# Patient Record
Sex: Female | Born: 1945 | Race: White | Hispanic: No | Marital: Married | Smoking: Never smoker
Health system: Southern US, Community
[De-identification: ages and names within clinical notes are randomized; demographics above are authoritative.]

## PROBLEM LIST (undated history)

## (undated) DIAGNOSIS — E039 Hypothyroidism, unspecified: Secondary | ICD-10-CM

## (undated) DIAGNOSIS — L659 Nonscarring hair loss, unspecified: Secondary | ICD-10-CM

## (undated) DIAGNOSIS — R0989 Other specified symptoms and signs involving the circulatory and respiratory systems: Secondary | ICD-10-CM

## (undated) DIAGNOSIS — L989 Disorder of the skin and subcutaneous tissue, unspecified: Secondary | ICD-10-CM

## (undated) DIAGNOSIS — M549 Dorsalgia, unspecified: Secondary | ICD-10-CM

## (undated) DIAGNOSIS — E109 Type 1 diabetes mellitus without complications: Secondary | ICD-10-CM

## (undated) DIAGNOSIS — I10 Essential (primary) hypertension: Secondary | ICD-10-CM

## (undated) DIAGNOSIS — F411 Generalized anxiety disorder: Secondary | ICD-10-CM

## (undated) DIAGNOSIS — I2510 Atherosclerotic heart disease of native coronary artery without angina pectoris: Secondary | ICD-10-CM

## (undated) DIAGNOSIS — E785 Hyperlipidemia, unspecified: Secondary | ICD-10-CM

## (undated) DIAGNOSIS — K219 Gastro-esophageal reflux disease without esophagitis: Secondary | ICD-10-CM

## (undated) DIAGNOSIS — J069 Acute upper respiratory infection, unspecified: Secondary | ICD-10-CM

## (undated) HISTORY — PX: CORONARY ARTERY BYPASS GRAFT: SHX141

## (undated) HISTORY — DX: Atherosclerotic heart disease of native coronary artery without angina pectoris: I25.10

## (undated) HISTORY — PX: TUBAL LIGATION: SHX77

## (undated) HISTORY — DX: Gastro-esophageal reflux disease without esophagitis: K21.9

## (undated) HISTORY — PX: CATARACT EXTRACTION: SUR2

## (undated) HISTORY — DX: Nonscarring hair loss, unspecified: L65.9

## (undated) HISTORY — DX: Acute upper respiratory infection, unspecified: J06.9

## (undated) HISTORY — PX: EYE SURGERY: SHX253

## (undated) HISTORY — DX: Other specified symptoms and signs involving the circulatory and respiratory systems: R09.89

## (undated) HISTORY — DX: Dorsalgia, unspecified: M54.9

## (undated) HISTORY — DX: Type 1 diabetes mellitus without complications: E10.9

## (undated) HISTORY — DX: Hyperlipidemia, unspecified: E78.5

## (undated) HISTORY — DX: Hypothyroidism, unspecified: E03.9

## (undated) HISTORY — DX: Generalized anxiety disorder: F41.1

## (undated) HISTORY — DX: Essential (primary) hypertension: I10

## (undated) HISTORY — PX: OTHER SURGICAL HISTORY: SHX169

## (undated) HISTORY — DX: Disorder of the skin and subcutaneous tissue, unspecified: L98.9

---

## 2004-11-14 ENCOUNTER — Emergency Department (HOSPITAL_COMMUNITY): Admission: EM | Admit: 2004-11-14 | Discharge: 2004-11-14 | Payer: Self-pay | Admitting: Emergency Medicine

## 2004-11-16 ENCOUNTER — Emergency Department (HOSPITAL_COMMUNITY): Admission: EM | Admit: 2004-11-16 | Discharge: 2004-11-16 | Payer: Self-pay | Admitting: Emergency Medicine

## 2007-07-22 ENCOUNTER — Ambulatory Visit: Payer: Self-pay | Admitting: Internal Medicine

## 2007-07-22 DIAGNOSIS — E039 Hypothyroidism, unspecified: Secondary | ICD-10-CM | POA: Insufficient documentation

## 2007-07-22 DIAGNOSIS — E785 Hyperlipidemia, unspecified: Secondary | ICD-10-CM | POA: Insufficient documentation

## 2007-07-22 DIAGNOSIS — I2510 Atherosclerotic heart disease of native coronary artery without angina pectoris: Secondary | ICD-10-CM | POA: Insufficient documentation

## 2007-07-22 DIAGNOSIS — E1065 Type 1 diabetes mellitus with hyperglycemia: Secondary | ICD-10-CM

## 2007-07-22 DIAGNOSIS — I10 Essential (primary) hypertension: Secondary | ICD-10-CM | POA: Insufficient documentation

## 2007-07-22 DIAGNOSIS — E109 Type 1 diabetes mellitus without complications: Secondary | ICD-10-CM

## 2007-07-22 HISTORY — DX: Type 1 diabetes mellitus without complications: E10.9

## 2007-07-22 HISTORY — DX: Hyperlipidemia, unspecified: E78.5

## 2007-07-22 HISTORY — DX: Hypothyroidism, unspecified: E03.9

## 2007-07-22 HISTORY — DX: Atherosclerotic heart disease of native coronary artery without angina pectoris: I25.10

## 2007-07-22 HISTORY — DX: Essential (primary) hypertension: I10

## 2007-07-23 LAB — CONVERTED CEMR LAB
ALT: 20 units/L (ref 0–35)
AST: 22 units/L (ref 0–37)
Albumin: 4.2 g/dL (ref 3.5–5.2)
Alkaline Phosphatase: 80 units/L (ref 39–117)
BUN: 8 mg/dL (ref 6–23)
Basophils Absolute: 0 10*3/uL (ref 0.0–0.1)
Basophils Relative: 0.2 % (ref 0.0–1.0)
Bilirubin Urine: NEGATIVE
Bilirubin, Direct: 0.1 mg/dL (ref 0.0–0.3)
CO2: 30 meq/L (ref 19–32)
Calcium: 9.7 mg/dL (ref 8.4–10.5)
Chloride: 105 meq/L (ref 96–112)
Cholesterol: 202 mg/dL (ref 0–200)
Creatinine, Ser: 0.8 mg/dL (ref 0.4–1.2)
Creatinine,U: 21.9 mg/dL
Direct LDL: 104.3 mg/dL
Eosinophils Absolute: 0.1 10*3/uL (ref 0.0–0.7)
Eosinophils Relative: 1.9 % (ref 0.0–5.0)
GFR calc Af Amer: 94 mL/min
GFR calc non Af Amer: 78 mL/min
Glucose, Bld: 191 mg/dL — ABNORMAL HIGH (ref 70–99)
HCT: 40.3 % (ref 36.0–46.0)
HDL: 74.9 mg/dL (ref >39.0)
Hemoglobin, Urine: NEGATIVE
Hemoglobin: 13.9 g/dL (ref 12.0–15.0)
Hgb A1c MFr Bld: 7.9 % — ABNORMAL HIGH (ref 4.6–6.0)
Ketones, ur: NEGATIVE mg/dL
Leukocytes, UA: NEGATIVE
Lymphocytes Relative: 23.5 % (ref 12.0–46.0)
MCHC: 34.4 g/dL (ref 30.0–36.0)
MCV: 89.6 fL (ref 78.0–100.0)
Microalb Creat Ratio: 9.1 mg/g (ref 0.0–30.0)
Microalb, Ur: 0.2 mg/dL (ref 0.0–1.9)
Monocytes Absolute: 0.3 10*3/uL (ref 0.1–1.0)
Monocytes Relative: 5.3 % (ref 3.0–12.0)
Neutro Abs: 4 10*3/uL (ref 1.4–7.7)
Neutrophils Relative %: 69.1 % (ref 43.0–77.0)
Nitrite: NEGATIVE
Platelets: 283 10*3/uL (ref 150–400)
Potassium: 5.2 meq/L — ABNORMAL HIGH (ref 3.5–5.1)
RBC: 4.5 M/uL (ref 3.87–5.11)
RDW: 12.8 % (ref 11.5–14.6)
Sodium: 139 meq/L (ref 135–145)
Specific Gravity, Urine: <=1.005 (ref 1.000–1.03)
TSH: 0.9 microintl units/mL (ref 0.35–5.50)
Total Bilirubin: 0.7 mg/dL (ref 0.3–1.2)
Total CHOL/HDL Ratio: 2.7
Total Protein, Urine: NEGATIVE mg/dL
Total Protein: 7.5 g/dL (ref 6.0–8.3)
Triglycerides: 61 mg/dL (ref 0–149)
Urine Glucose: NEGATIVE mg/dL
Urobilinogen, UA: 0.2 (ref 0.0–1.0)
VLDL: 12 mg/dL (ref 0–40)
WBC: 5.8 10*3/uL (ref 4.5–10.5)
pH: 7 (ref 5.0–8.0)

## 2007-10-05 ENCOUNTER — Ambulatory Visit: Payer: Self-pay | Admitting: Cardiology

## 2007-10-27 ENCOUNTER — Ambulatory Visit: Payer: Self-pay | Admitting: Internal Medicine

## 2007-10-27 ENCOUNTER — Encounter (INDEPENDENT_AMBULATORY_CARE_PROVIDER_SITE_OTHER): Payer: Self-pay | Admitting: *Deleted

## 2007-10-27 DIAGNOSIS — L989 Disorder of the skin and subcutaneous tissue, unspecified: Secondary | ICD-10-CM

## 2007-10-27 HISTORY — DX: Disorder of the skin and subcutaneous tissue, unspecified: L98.9

## 2007-10-28 DIAGNOSIS — F411 Generalized anxiety disorder: Secondary | ICD-10-CM | POA: Insufficient documentation

## 2007-10-28 HISTORY — DX: Generalized anxiety disorder: F41.1

## 2007-10-30 ENCOUNTER — Telehealth (INDEPENDENT_AMBULATORY_CARE_PROVIDER_SITE_OTHER): Payer: Self-pay | Admitting: *Deleted

## 2007-11-16 ENCOUNTER — Ambulatory Visit: Payer: Self-pay

## 2007-11-16 ENCOUNTER — Encounter: Payer: Self-pay | Admitting: Cardiology

## 2007-11-30 ENCOUNTER — Ambulatory Visit: Payer: Self-pay | Admitting: Endocrinology

## 2007-11-30 LAB — CONVERTED CEMR LAB: Hgb A1c MFr Bld: 7.5 % — ABNORMAL HIGH (ref 4.6–6.0)

## 2007-12-23 ENCOUNTER — Ambulatory Visit: Payer: Self-pay | Admitting: Internal Medicine

## 2007-12-23 DIAGNOSIS — M549 Dorsalgia, unspecified: Secondary | ICD-10-CM | POA: Insufficient documentation

## 2007-12-23 DIAGNOSIS — J069 Acute upper respiratory infection, unspecified: Secondary | ICD-10-CM

## 2007-12-23 DIAGNOSIS — K219 Gastro-esophageal reflux disease without esophagitis: Secondary | ICD-10-CM

## 2007-12-23 HISTORY — DX: Acute upper respiratory infection, unspecified: J06.9

## 2007-12-23 HISTORY — DX: Gastro-esophageal reflux disease without esophagitis: K21.9

## 2007-12-23 HISTORY — DX: Dorsalgia, unspecified: M54.9

## 2008-06-01 ENCOUNTER — Telehealth: Payer: Self-pay | Admitting: Internal Medicine

## 2008-08-22 ENCOUNTER — Encounter (INDEPENDENT_AMBULATORY_CARE_PROVIDER_SITE_OTHER): Payer: Self-pay | Admitting: *Deleted

## 2008-09-13 ENCOUNTER — Ambulatory Visit: Payer: Self-pay | Admitting: Internal Medicine

## 2008-09-14 ENCOUNTER — Encounter: Payer: Self-pay | Admitting: Internal Medicine

## 2008-09-14 LAB — CONVERTED CEMR LAB
ALT: 20 units/L (ref 0–35)
AST: 21 units/L (ref 0–37)
Albumin: 4.3 g/dL (ref 3.5–5.2)
Alkaline Phosphatase: 84 units/L (ref 39–117)
BUN: 14 mg/dL (ref 6–23)
Basophils Absolute: 0 10*3/uL (ref 0.0–0.1)
Basophils Relative: 0.1 % (ref 0.0–3.0)
Bilirubin Urine: NEGATIVE
Bilirubin, Direct: 0.1 mg/dL (ref 0.0–0.3)
CO2: 31 meq/L (ref 19–32)
Calcium: 9.7 mg/dL (ref 8.4–10.5)
Chloride: 105 meq/L (ref 96–112)
Cholesterol: 226 mg/dL — ABNORMAL HIGH (ref 0–200)
Creatinine, Ser: 0.9 mg/dL (ref 0.4–1.2)
Creatinine,U: 47.4 mg/dL
Direct LDL: 137.1 mg/dL
Eosinophils Absolute: 0.1 10*3/uL (ref 0.0–0.7)
Eosinophils Relative: 1.7 % (ref 0.0–5.0)
GFR calc non Af Amer: 67.25 mL/min (ref >60)
Glucose, Bld: 147 mg/dL — ABNORMAL HIGH (ref 70–99)
HCT: 41.7 % (ref 36.0–46.0)
HDL: 63.2 mg/dL (ref >39.00)
Hemoglobin, Urine: NEGATIVE
Hemoglobin: 14.5 g/dL (ref 12.0–15.0)
Hgb A1c MFr Bld: 7.7 % — ABNORMAL HIGH (ref 4.6–6.5)
Ketones, ur: NEGATIVE mg/dL
Leukocytes, UA: NEGATIVE
Lymphocytes Relative: 21.8 % (ref 12.0–46.0)
Lymphs Abs: 1.5 10*3/uL (ref 0.7–4.0)
MCHC: 34.8 g/dL (ref 30.0–36.0)
MCV: 89.6 fL (ref 78.0–100.0)
Microalb Creat Ratio: 2.1 mg/g (ref 0.0–30.0)
Microalb, Ur: 0.1 mg/dL (ref 0.0–1.9)
Monocytes Absolute: 0.3 10*3/uL (ref 0.1–1.0)
Monocytes Relative: 4.1 % (ref 3.0–12.0)
Neutro Abs: 5 10*3/uL (ref 1.4–7.7)
Neutrophils Relative %: 72.3 % (ref 43.0–77.0)
Nitrite: NEGATIVE
Platelets: 264 10*3/uL (ref 150.0–400.0)
Potassium: 4.6 meq/L (ref 3.5–5.1)
RBC: 4.66 M/uL (ref 3.87–5.11)
RDW: 12.8 % (ref 11.5–14.6)
Sodium: 142 meq/L (ref 135–145)
Specific Gravity, Urine: <=1.005 (ref 1.000–1.030)
TSH: 1.64 microintl units/mL (ref 0.35–5.50)
Total Bilirubin: 0.9 mg/dL (ref 0.3–1.2)
Total CHOL/HDL Ratio: 4
Total Protein, Urine: NEGATIVE mg/dL
Total Protein: 8 g/dL (ref 6.0–8.3)
Triglycerides: 93 mg/dL (ref 0.0–149.0)
Urine Glucose: NEGATIVE mg/dL
Urobilinogen, UA: 0.2 (ref 0.0–1.0)
VLDL: 18.6 mg/dL (ref 0.0–40.0)
WBC: 6.9 10*3/uL (ref 4.5–10.5)
pH: 6.5 (ref 5.0–8.0)

## 2008-09-15 LAB — CONVERTED CEMR LAB: Vit D, 25-Hydroxy: 17 ng/mL — ABNORMAL LOW (ref 30–89)

## 2008-10-17 ENCOUNTER — Encounter: Payer: Self-pay | Admitting: Internal Medicine

## 2009-02-17 ENCOUNTER — Ambulatory Visit: Payer: Self-pay | Admitting: Internal Medicine

## 2009-02-17 DIAGNOSIS — L659 Nonscarring hair loss, unspecified: Secondary | ICD-10-CM

## 2009-02-17 HISTORY — DX: Nonscarring hair loss, unspecified: L65.9

## 2009-02-18 LAB — CONVERTED CEMR LAB
CO2: 19 meq/L (ref 19–32)
Calcium: 10.4 mg/dL (ref 8.4–10.5)
Cholesterol: 201 mg/dL — ABNORMAL HIGH (ref 0–200)
HDL: 70 mg/dL (ref >39)
Sodium: 139 meq/L (ref 135–145)
Total CHOL/HDL Ratio: 2.9

## 2009-02-20 LAB — CONVERTED CEMR LAB: TSH: 0.57 microintl units/mL (ref 0.35–5.50)

## 2009-05-17 ENCOUNTER — Encounter: Payer: Self-pay | Admitting: Internal Medicine

## 2009-07-28 ENCOUNTER — Ambulatory Visit: Payer: Self-pay | Admitting: Cardiology

## 2009-07-28 DIAGNOSIS — R0989 Other specified symptoms and signs involving the circulatory and respiratory systems: Secondary | ICD-10-CM

## 2009-07-28 HISTORY — DX: Other specified symptoms and signs involving the circulatory and respiratory systems: R09.89

## 2009-08-02 ENCOUNTER — Telehealth (INDEPENDENT_AMBULATORY_CARE_PROVIDER_SITE_OTHER): Payer: Self-pay | Admitting: *Deleted

## 2009-08-03 ENCOUNTER — Encounter (HOSPITAL_COMMUNITY): Admission: RE | Admit: 2009-08-03 | Discharge: 2009-10-11 | Payer: Self-pay | Admitting: Cardiology

## 2009-08-03 ENCOUNTER — Ambulatory Visit: Payer: Self-pay

## 2009-08-03 ENCOUNTER — Encounter: Payer: Self-pay | Admitting: Cardiology

## 2009-08-03 ENCOUNTER — Ambulatory Visit: Payer: Self-pay | Admitting: Cardiology

## 2009-08-04 LAB — CONVERTED CEMR LAB
BUN: 22 mg/dL (ref 6–23)
CO2: 26 meq/L (ref 19–32)
Chloride: 99 meq/L (ref 96–112)
Creatinine, Ser: 0.8 mg/dL (ref 0.4–1.2)

## 2009-08-16 ENCOUNTER — Telehealth: Payer: Self-pay | Admitting: Internal Medicine

## 2009-08-16 ENCOUNTER — Ambulatory Visit: Payer: Self-pay | Admitting: Internal Medicine

## 2009-08-16 LAB — CONVERTED CEMR LAB
ALT: 19 units/L (ref 0–35)
AST: 20 units/L (ref 0–37)
BUN: 18 mg/dL (ref 6–23)
Basophils Relative: 0.4 % (ref 0.0–3.0)
Bilirubin, Direct: 0.1 mg/dL (ref 0.0–0.3)
CO2: 31 meq/L (ref 19–32)
Calcium: 9.5 mg/dL (ref 8.4–10.5)
Creatinine, Ser: 0.7 mg/dL (ref 0.4–1.2)
Eosinophils Absolute: 0.2 10*3/uL (ref 0.0–0.7)
Glucose, Bld: 80 mg/dL (ref 70–99)
Lymphs Abs: 2.2 10*3/uL (ref 0.7–4.0)
MCHC: 34.2 g/dL (ref 30.0–36.0)
MCV: 90.5 fL (ref 78.0–100.0)
Monocytes Absolute: 0.4 10*3/uL (ref 0.1–1.0)
Neutrophils Relative %: 64.5 % (ref 43.0–77.0)
Platelets: 287 10*3/uL (ref 150.0–400.0)
Total Bilirubin: 0.7 mg/dL (ref 0.3–1.2)

## 2010-02-01 ENCOUNTER — Encounter: Payer: Self-pay | Admitting: Internal Medicine

## 2010-02-06 ENCOUNTER — Ambulatory Visit: Admit: 2010-02-06 | Payer: Self-pay | Admitting: Internal Medicine

## 2010-02-13 ENCOUNTER — Encounter: Payer: Self-pay | Admitting: Internal Medicine

## 2010-02-14 ENCOUNTER — Ambulatory Visit
Admission: RE | Admit: 2010-02-14 | Discharge: 2010-02-14 | Payer: Self-pay | Source: Home / Self Care | Attending: Internal Medicine | Admitting: Internal Medicine

## 2010-02-14 ENCOUNTER — Other Ambulatory Visit: Payer: Self-pay | Admitting: Internal Medicine

## 2010-02-14 LAB — BASIC METABOLIC PANEL
BUN: 13 mg/dL (ref 6–23)
CO2: 30 mEq/L (ref 19–32)
Calcium: 9.8 mg/dL (ref 8.4–10.5)
Chloride: 103 mEq/L (ref 96–112)
Creatinine, Ser: 0.8 mg/dL (ref 0.4–1.2)
GFR: 82.62 mL/min (ref >60.00)
Glucose, Bld: 58 mg/dL — ABNORMAL LOW (ref 70–99)
Potassium: 5.2 mEq/L — ABNORMAL HIGH (ref 3.5–5.1)
Sodium: 140 mEq/L (ref 135–145)

## 2010-02-14 LAB — TSH: TSH: 0.83 u[IU]/mL (ref 0.35–5.50)

## 2010-02-14 LAB — LIPID PANEL
Cholesterol: 236 mg/dL — ABNORMAL HIGH (ref 0–200)
HDL: 68.9 mg/dL (ref >39.00)
Total CHOL/HDL Ratio: 3
Triglycerides: 91 mg/dL (ref 0.0–149.0)
VLDL: 18.2 mg/dL (ref 0.0–40.0)

## 2010-02-14 LAB — HEMOGLOBIN A1C: Hgb A1c MFr Bld: 8 % — ABNORMAL HIGH (ref 4.6–6.5)

## 2010-02-14 LAB — LDL CHOLESTEROL, DIRECT: Direct LDL: 147.2 mg/dL

## 2010-02-15 ENCOUNTER — Telehealth: Payer: Self-pay | Admitting: Internal Medicine

## 2010-02-15 ENCOUNTER — Encounter: Payer: Self-pay | Admitting: Internal Medicine

## 2010-03-01 ENCOUNTER — Encounter: Payer: Self-pay | Admitting: Internal Medicine

## 2010-03-06 NOTE — Progress Notes (Signed)
Summary: Nuclear Pre-Procedure  Phone Note Outgoing Call   Call placed by: Milana Na, EMT-P,  August 02, 2009 1:03 PM Summary of Call: Reviewed information on Myoview Information Sheet (see scanned document for further details).  Spoke with patient.     Nuclear Med Background Indications for Stress Test: Evaluation for Ischemia, Graft Patency   History: CABG, Echo, Heart Catheterization, Stents  History Comments: '05 Heart Cath--Stents(failed) 12/05 CABG 10/09 ECHO EF 60% mild -mod. TR     Nuclear Pre-Procedure Cardiac Risk Factors: Carotid Disease, Hypertension, IDDM Type 1, Lipids Height (in): 64  Nuclear Med Study Referring MD:  B.Crenshaw

## 2010-03-06 NOTE — Letter (Signed)
Summary: [facility]  [facility]   Imported By: Lennie Odor 05/25/2009 12:29:45  _____________________________________________________________________  External Attachment:    Type:   Image     Comment:   External Document

## 2010-03-06 NOTE — Assessment & Plan Note (Signed)
Summary: Cardiology Nuclear Study  Nuclear Med Background Indications for Stress Test: Evaluation for Ischemia, Graft Patency   History: CABG, Echo, Heart Catheterization, Stents  History Comments: '05 Heart Cath--Stents(failed) 12/05 CABG 10/09 ECHO EF 60% mild -mod. TR     Nuclear Pre-Procedure Cardiac Risk Factors: Carotid Disease, Hypertension, IDDM Type 1, Lipids Caffeine/Decaff Intake: None NPO After: 11:00 PM Lungs: clear IV 0.9% NS with Angio Cath: 20g     IV Site: (R) AC IV Started by: Stanton Kidney EMT-P Chest Size (in) 38     Cup Size B     Height (in): 64 Weight (lb): 160 BMI: 27.56 Tech Comments: CBG=111 @ 7:30 am, per Patient.  This patient had positive changes on her EKG during her exercise [place] stress test. Her pictures were good, Dr. Shirlee Latch (DOD) reviewed the tracings and approved for her to leave.  Nuclear Med Study 1 or 2 day study:  1 day     Stress Test Type:  Stress Reading MD:  Olga Millers, MD     Referring MD:  B.Crenshaw Resting Radionuclide:  Technetium 34m Tetrofosmin     Resting Radionuclide Dose:  11.0 mCi  Stress Radionuclide:  Technetium 32m Tetrofosmin     Stress Radionuclide Dose:  33.0 mCi   Stress Protocol Exercise Time (min):  7:00 min     Max HR:  137 bpm     Predicted Max HR:  157 bpm  Max Systolic BP: 192 mm Hg     Percent Max HR:  87.26 %     METS: 8.5 Rate Pressure Product:  16109    Stress Test Technologist:  Milana Na EMT-P     Nuclear Technologist:  Domenic Polite CNMT  Rest Procedure  Myocardial perfusion imaging was performed at rest 45 minutes following the intravenous administration of Myoview Technetium 96m Tetrofosmin.  Stress Procedure  The patient exercised for 7:00. The patient stopped due to fatigue and denied any chest pain.  There were significant ST-T wave changes.  Myoview was injected at peak exercise and myocardial perfusion imaging was performed after a brief delay.  QPS Raw Data Images:   Acuisition technically good; normal left ventricular size. Stress Images:  There is normal uptake in all areas. Rest Images:  Normal homogeneous uptake in all areas of the myocardium. Subtraction (SDS):  No evidence of ischemia. Transient Ischemic Dilatation:  .91  (Normal <1.22)  Lung/Heart Ratio:  .31  (Normal <0.45)  Quantitative Gated Spect Images QGS EDV:  46 ml QGS ESV:  10 ml QGS EF:  78 % QGS cine images:  Normal wall motion.   Overall Impression  Exercise Capacity: Fair exercise capacity. BP Response: Normal blood pressure response. Clinical Symptoms: No chest pain ECG Impression: Insignificant upsloping ST segment depression. Overall Impression: There is no sign of scar or ischemia.  Appended Document: Cardiology Nuclear Study pt aware of results    Appended Document: Cardiology Nuclear Study ok

## 2010-03-06 NOTE — Assessment & Plan Note (Signed)
Summary: HAIR IS FALLING OUT/NWS   Vital Signs:  Patient profile:   65 year old female Height:      64 inches Weight:      156 pounds BMI:     26.87 O2 Sat:      98 % on Room air Temp:     97.5 degrees F oral Pulse rate:   89 / minute BP sitting:   160 / 96  (left arm) Cuff size:   regular  Vitals Entered ByZella Ball Ewing (February 17, 2009 2:53 PM)  O2 Flow:  Room air CC: Hair loss/RE   CC:  Hair loss/RE.  History of Present Illness: c/o increased stress in the last 2 months,  has had some hair thinning as well so stopped her BP med in the past wk since she saw on the side effects regarding hair loss;  Pt denies CP, sob, doe, wheezing, orthopnea, pnd, worsening LE edema, palps, dizziness or syncope  Pt denies new neuro symptoms such as headache, facial or extremity weakness   trying to follow low chol diet, but has not been taking the Vit D as per last OV>  Pt denies polydipsia, polyuria, or low sugar symptoms such as shakiness improved with eating.  Overall good compliance with meds, trying to follow low chol, DM diet, wt stable, little excercise however Wants flu shot today, but has not yet started statin.    Problems Prior to Update: 1)  Hair Loss  (ICD-704.00) 2)  Skin Lesion  (ICD-709.9) 3)  Preventive Health Care  (ICD-V70.0) 4)  Gerd  (ICD-530.81) 5)  Uri  (ICD-465.9) 6)  Back Pain  (ICD-724.5) 7)  Anxiety  (ICD-300.00) 8)  Skin Lesion  (ICD-709.9) 9)  Hyperlipidemia  (ICD-272.4) 10)  Preventive Health Care  (ICD-V70.0) 11)  Hypertension  (ICD-401.9) 12)  Hypothyroidism  (ICD-244.9) 13)  Coronary Artery Disease  (ICD-414.00) 14)  Diabetes Mellitus, Type I  (ICD-250.01)  Medications Prior to Update: 1)  Levoxyl 88 Mcg  Tabs (Levothyroxine Sodium) .Marland Kitchen.. 1po Once Daily 2)  Novolog 100 Unit/ml  Soln (Insulin Aspart) .... 35-40 Units Daily 3)  Amlodipine Besylate 5 Mg Tabs (Amlodipine Besylate) .Marland Kitchen.. 1 By Mouth Once Daily 4)  Losartan Potassium 100 Mg Tabs (Losartan  Potassium) .Marland Kitchen.. 1 By Mouth Once Daily 5)  Aspir-Low 81 Mg Tbec (Aspirin) .Marland Kitchen.. 1 By Mouth Qd 6)  Simvastatin 40 Mg Tabs (Simvastatin) .Marland Kitchen.. 1 By Mouth Once Daily  Current Medications (verified): 1)  Levoxyl 88 Mcg  Tabs (Levothyroxine Sodium) .Marland Kitchen.. 1po Once Daily 2)  Novolog 100 Unit/ml  Soln (Insulin Aspart) .... 35-40 Units Daily 3)  Amlodipine Besylate 5 Mg Tabs (Amlodipine Besylate) .Marland Kitchen.. 1 By Mouth Once Daily 4)  Losartan Potassium 100 Mg Tabs (Losartan Potassium) .Marland Kitchen.. 1 By Mouth Once Daily 5)  Aspir-Low 81 Mg Tbec (Aspirin) .Marland Kitchen.. 1 By Mouth Qd 6)  Simvastatin 40 Mg Tabs (Simvastatin) .Marland Kitchen.. 1 By Mouth Once Daily 7)  Vitamin D3 1000 Unit Tabs (Cholecalciferol) .Marland Kitchen.. 1 By Mouth Once Daily  Allergies (verified): 1)  ! Accupril 2)  ! Toprol Xl (Metoprolol Succinate) 3)  Cozaar 4)  Lipitor 5)  * Benicar  Past History:  Past Surgical History: Last updated: 07/22/2007 s/p coronary stent Coronary artery bypass graft s/p c-section x 2  Social History: Last updated: 07/22/2007 Married Never Smoked Alcohol use-no 2 children work - Manufacturing engineer for [employer]  Risk Factors: Smoking Status: never (07/22/2007)  Past Medical History: Diabetes mellitus, type I - sees Dr  Kumar Coronary artery disease -  Hypothyroidism Hypertension Anxiety GERD  Review of Systems       all otherwise negative per pt -   Physical Exam  General:  alert and well-developed.   Head:  normocephalic and atraumatic.   Eyes:  vision grossly intact, pupils equal, and pupils round.   Ears:  R ear normal and L ear normal.   Nose:  no external deformity and no nasal discharge.   Mouth:  no gingival abnormalities and pharynx pink and moist.   Neck:  supple and no masses.   Lungs:  normal respiratory effort and normal breath sounds.   Heart:  normal rate and regular rhythm.   Extremities:  no edema, no erythema  Skin:  no alopeci, may have some mild diffuse hair thinning   Impression &  Recommendations:  Problem # 1:  HAIR LOSS (ICD-704.00)  Orders: TLB-TSH (Thyroid Stimulating Hormone) (84443-TSH)  doubt BP med as cause, more likely stress or thyroid - to check TSH  Problem # 2:  HYPERTENSION (ICD-401.9)  Her updated medication list for this problem includes:    Amlodipine Besylate 5 Mg Tabs (Amlodipine besylate) .Marland Kitchen... 1 by mouth once daily    Losartan Potassium 100 Mg Tabs (Losartan potassium) .Marland Kitchen... 1 by mouth once daily  BP today: 160/96 Prior BP: 160/74 (09/13/2008)  Labs Reviewed: K+: 4.6 (09/13/2008) Creat: : 0.9 (09/13/2008)   Chol: 226 (09/13/2008)   HDL: 63.20 (09/13/2008)   LDL: DEL (07/22/2007)   TG: 93.0 (09/13/2008) to re-start BP med, f/u next visit  Problem # 3:  DIABETES MELLITUS, TYPE I (ICD-250.01)  Her updated medication list for this problem includes:    Novolog 100 Unit/ml Soln (Insulin aspart) .Marland KitchenMarland KitchenMarland KitchenMarland Kitchen 35-40 units daily    Losartan Potassium 100 Mg Tabs (Losartan potassium) .Marland Kitchen... 1 by mouth once daily    Aspir-low 81 Mg Tbec (Aspirin) .Marland Kitchen... 1 by mouth qd  Orders: T- * Misc. Laboratory test [phone]) TLB-A1C / Hgb A1C (Glycohemoglobin) (83036-A1C) stable overall by hx and exam, ok to continue meds/tx as is, Pt to cont DM diet, excercise, wt loss efforts; to check labs today   Problem # 4:  HYPERLIPIDEMIA (ICD-272.4)  Her updated medication list for this problem includes:    Simvastatin 40 Mg Tabs (Simvastatin) .Marland Kitchen... 1 by mouth once daily  Labs Reviewed: SGOT: 21 (09/13/2008)   SGPT: 20 (09/13/2008)   HDL:63.20 (09/13/2008), 74.9 (07/22/2007)  LDL:DEL (07/22/2007)  Chol:226 (09/13/2008), 202 (07/22/2007)  Trig:93.0 (09/13/2008), 61 (07/22/2007) d/w pt - to start statin, Pt to continue diet efforts, good med tolerance; to check labs - goal LDL less than 70   Complete Medication List: 1)  Levoxyl 88 Mcg Tabs (Levothyroxine sodium) .Marland Kitchen.. 1po once daily 2)  Novolog 100 Unit/ml Soln (Insulin aspart) .... 35-40 units daily 3)  Amlodipine  Besylate 5 Mg Tabs (Amlodipine besylate) .Marland Kitchen.. 1 by mouth once daily 4)  Losartan Potassium 100 Mg Tabs (Losartan potassium) .Marland Kitchen.. 1 by mouth once daily 5)  Aspir-low 81 Mg Tbec (Aspirin) .Marland Kitchen.. 1 by mouth qd 6)  Simvastatin 40 Mg Tabs (Simvastatin) .Marland Kitchen.. 1 by mouth once daily 7)  Vitamin D3 1000 Unit Tabs (Cholecalciferol) .Marland Kitchen.. 1 by mouth once daily  Other Orders: Flu Vaccine 40yrs + (60454) Administration Flu vaccine - MCR (U9811)  Patient Instructions: 1)  Please go to the Lab in the basement for your blood and/or urine tests today 2)  please start vit D 1000 units per day (OTC) 3)  you had the flu  shot 4)  Continue all previous medications as before this visit , including the losartan as this should not be related to the hair loss 5)  please keep your appt with DR Lucianne Muss as planned 6)  Please schedule a follow-up appointment in 6 months with CPX labs and: 7)  HbgA1C prior to visit, ICD-9: 250.02 8)  Urine Microalbumin prior to visit, ICD-9:     Flu Vaccine Consent Questions     Do you have a history of severe allergic reactions to this vaccine? no    Any prior history of allergic reactions to egg and/or gelatin? no    Do you have a sensitivity to the preservative Thimersol? no    Do you have a past history of Guillan-Barre Syndrome? no    Do you currently have an acute febrile illness? no    Have you ever had a severe reaction to latex? no    Vaccine information given and explained to patient? yes    Are you currently pregnant? no    Lot Number:[id number]   Exp Date:08/03/2009   Site Given  Left Deltoid IMflu

## 2010-03-06 NOTE — Assessment & Plan Note (Signed)
Summary: 1 YR ROV  PER PT  PFH,RN  Medications Added LOSARTAN POTASSIUM-HCTZ 100-12.5 MG TABS (LOSARTAN POTASSIUM-HCTZ) ONE TABLET by mouth once daily ASPIR-LOW 81 MG TBEC (ASPIRIN) HOLD 1 by mouth once daily MULTIVITAMINS   TABS (MULTIPLE VITAMIN) 1 tab by mouth once daily PRAVASTATIN SODIUM 40 MG TABS (PRAVASTATIN SODIUM) Take one tablet by mouth daily at bedtime        History of Present Illness: Stacy Cain is a very pleasant  female who has a past medical history of coronary artery disease who returns for follow up.    In 2005, she had a stent that apparently failed and underwent bypass surgery in December 2005. Echocardiogram in October of 2009 showed normal LV function and mild to moderate tricuspid regurgitation. I last saw her in August of 2009. Since then the patient denies any dyspnea on exertion, orthopnea, PND,  palpitations, syncope or chest pain. She has developed pedal edema which she attributed to her losartan and discontinued that medication.    Current Medications (verified): 1)  Levoxyl 88 Mcg  Tabs (Levothyroxine Sodium) .Marland Kitchen.. 1po Once Daily 2)  Novolog 100 Unit/ml  Soln (Insulin Aspart) .... 35-40 Units Daily 3)  Amlodipine Besylate 5 Mg Tabs (Amlodipine Besylate) .Marland Kitchen.. 1 By Mouth Once Daily 4)  Losartan Potassium 100 Mg Tabs (Losartan Potassium) .... Hold 1 By Mouth Once Daily 5)  Aspir-Low 81 Mg Tbec (Aspirin) .... Hold 1 By Mouth Once Daily 6)  Multivitamins   Tabs (Multiple Vitamin) .Marland Kitchen.. 1 Tab By Mouth Once Daily  Allergies: 1)  ! Accupril 2)  ! Toprol Xl (Metoprolol Succinate) 3)  Cozaar 4)  Lipitor 5)  * Benicar  Past History:  Past Medical History: HYPERLIPIDEMIA (ICD-272.4) HYPERTENSION (ICD-401.9) CORONARY ARTERY DISEASE (ICD-414.00) GERD (ICD-530.81) ANXIETY (ICD-300.00) DIABETES MELLITUS, TYPE I (ICD-250.01) - sees Dr Lucianne Muss HYPOTHYROIDISM (ICD-244.9)  Past Surgical History: Reviewed history from 03/29/2009 and no changes required. s/p coronary  stent Coronary artery bypass graft s/p c-section x 2  tubal ligation  Social History: Reviewed history from 07/22/2007 and no changes required. 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 Remaining systems are negative.   Vital Signs:  Patient profile:   65 year old female Height:      64 inches Weight:      164 pounds BMI:     28.25 Pulse rate:   84 / minute Resp:     12 per minute BP sitting:   160 / 60  (left arm)  Vitals Entered By: Kem Parkinson (July 28, 2009 2:13 PM)  Physical Exam  General:  Well-developed well-nourished in no acute distress.  Skin is warm and dry.  HEENT is normal.  Neck is supple. No thyromegaly. Right carotid bruit. Chest is clear to auscultation with normal expansion.  Cardiovascular exam is regular rate and rhythm.  Abdominal exam nontender or distended. No masses palpated. Extremities show trace edema. neuro grossly intact    EKG  Procedure date:  07/28/2009  Findings:      Sinus rhythm at a rate of 84. Nonspecific ST changes.  Impression & Recommendations:  Problem # 1:  CAROTID BRUIT (ICD-785.9) Check carotid Dopplers.  Problem # 2:  HYPERLIPIDEMIA (ICD-272.4)  Patient discontinued Zocor on her own. Will add Pravachol 40 mg p.o. daily. Check lipids  and liver in 6 weeks. The following medications were removed from the medication list:    Simvastatin 40 Mg Tabs (Simvastatin) .Marland Kitchen... 1 by mouth once daily Her updated medication list for this problem includes:    Pravastatin Sodium 40 Mg Tabs (Pravastatin sodium) .Marland Kitchen... Take one tablet by mouth daily at bedtime  The following medications were removed from the medication list:    Simvastatin 40 Mg Tabs (Simvastatin) .Marland Kitchen... 1 by mouth  once daily  Problem # 3:  HYPERTENSION (ICD-401.9) Patient is complaining of lower extremity edema. Discontinue amlodipine to see if this helps. Resume losartan  at previous dose and add HCTZ 12.5 mg p.o. daily. Check bmet in one week. The following medications were removed from the medication list:    Amlodipine Besylate 5 Mg Tabs (Amlodipine besylate) .Marland Kitchen... 1 by mouth once daily Her updated medication list for this problem includes:    Losartan Potassium 100 Mg Tabs (Losartan potassium) ..... Hold 1 by mouth once daily    Aspir-low 81 Mg Tbec (Aspirin) ..... Hold 1 by mouth once daily  Problem # 4:  CORONARY ARTERY DISEASE (ICD-414.00)  Continue aspirin. Add statin. Schedule Myoview. The following medications were removed from the medication list:    Amlodipine Besylate 5 Mg Tabs (Amlodipine besylate) .Marland Kitchen... 1 by mouth once daily Her updated medication list for this problem includes:    Aspir-low 81 Mg Tbec (Aspirin) ..... Hold 1 by mouth once daily  Orders: Nuclear Stress Test (Nuc Stress Test)  The following medications were removed from the medication list:    Amlodipine Besylate 5 Mg Tabs (Amlodipine besylate) .Marland Kitchen... 1 by mouth once daily Her updated medication list for this problem includes:    Aspir-low 81 Mg Tbec (Aspirin) ..... Hold 1 by mouth once daily  Problem # 5:  GERD (ICD-530.81)  Problem # 6:  DIABETES MELLITUS, TYPE I (ICD-250.01)  Her updated medication list for this problem includes:    Novolog 100 Unit/ml Soln (Insulin aspart) .Marland KitchenMarland KitchenMarland KitchenMarland Kitchen 35-40 units daily    Losartan Potassium-hctz 100-12.5 Mg Tabs (Losartan potassium-hctz) ..... One tablet by mouth once daily    Aspir-low 81 Mg Tbec (Aspirin) ..... Hold 1 by mouth once daily  Her updated medication list for this problem includes:    Novolog 100 Unit/ml Soln (Insulin aspart) .Marland KitchenMarland KitchenMarland KitchenMarland Kitchen 35-40 units daily    Losartan Potassium-hctz 100-12.5 Mg Tabs (Losartan potassium-hctz) ..... One tablet by mouth once daily     Aspir-low 81 Mg Tbec (Aspirin) ..... Hold 1 by mouth once daily  Problem # 7:  HYPOTHYROIDISM (ICD-244.9)  Her updated medication list for this problem includes:    Levoxyl 88 Mcg Tabs (Levothyroxine sodium) .Marland Kitchen... 1po once daily  Her updated medication list for this problem includes:    Levoxyl 88 Mcg Tabs (Levothyroxine sodium) .Marland Kitchen... 1po once daily  Other Orders: Carotid Duplex (Carotid Duplex)  Patient Instructions: 1)  Your physician recommends that you schedule a follow-up appointment in: ONE YEAR 2)  Your physician recommends that you return for a FASTING lipid profile: IN 6 WEEKS-LIPID/LIVER-272.0/V58.69 3)  Your physician recommends that you return for lab work in:ONE WEEK WITH STRESS TEST- BMP-401.1/V58.69 4)  Your physician has recommended you make the following change in your medication: STOP AMLODIPINE 5)  START LOSARTAN/HCT 100/12.5MG  ONE TABLET ONCE DAILY 6)  START PRAVASTATIN 40MG  ONCE DAILY AT BEDTIME 7)  Your physician has requested that you have a carotid duplex. This test is an ultrasound of the carotid arteries in your neck. It looks at  blood flow through these arteries that supply the brain with blood. Allow one hour for this exam. There are no restrictions or special instructions. 8)  Your physician has requested that you have an exercise stress myoview.  For further information please visit [email or web address]  Please follow instruction sheet, as given. Prescriptions: PRAVASTATIN SODIUM 40 MG TABS (PRAVASTATIN SODIUM) Take one tablet by mouth daily at bedtime  #30 x 12   Entered by:   Deliah Goody, RN   Authorized by:   Ferman Hamming, MD, [facility]   Signed by:   Deliah Goody, RN on 07/28/2009   Method used:   Electronically to        [facility] [address] [phone]* (retail)       [address]       [place], [place]  [address]       Ph: [phone] or [phone]       Fax: [phone]   RxID:   [id number] LOSARTAN  POTASSIUM-HCTZ 100-12.5 MG TABS (LOSARTAN POTASSIUM-HCTZ) ONE TABLET by mouth once daily  #30 x 12   Entered by:   Deliah Goody, RN   Authorized by:   Ferman Hamming, MD, [facility]   Signed by:   Deliah Goody, RN on 07/28/2009   Method used:   [facility]        [facility] [address] [phone]* (retail)       [address]       [place], [place]  [address]       Ph: [phone] or [phone]       Fax: [phone]   RxID:   [id number]

## 2010-03-06 NOTE — Assessment & Plan Note (Signed)
Summary: 6 mos f/u #/cd   Vital Signs:  Patient profile:   65 year old female Height:      64 inches Weight:      163 pounds BMI:     28.08 O2 Sat:      96 % on Room air Temp:     98.6 degrees F oral Pulse rate:   82 / minute BP sitting:   160 / 78  (left arm) Cuff size:   regular  Vitals Entered By: [name] CMA Duncan Dull) (August 16, 2009 1:35 PM)  O2 Flow:  Room air  Preventive Care Screening     decelines colonosocpy and tetanus  CC: 6 month followup/RE   CC:  6 month followup/RE.  History of Present Illness: here to f/u - amlodipine was stopped due to leg edema, now on current meds; Pt denies CP, sob, doe, wheezing, orthopnea, pnd, worsening LE edema, palps, dizziness or syncope  Pt denies new neuro symptoms such as headache, facial or extremity weakness  Pt denies polydipsia, polyuria, or low sugar symptoms such as shakiness improved with eating.  Overall good compliance with meds, trying to follow low chol, DM diet, wt stable, little excercise however  Is quite wary of new meds, has not started the new statin. and certatily does not want to consider change in DM or HTN meds today  Preventive Screening-Counseling & Management      Drug Use:  no.    Problems Prior to Update: 1)  Carotid Bruit  (ICD-785.9) 2)  Hyperlipidemia  (ICD-272.4) 3)  Hypertension  (ICD-401.9) 4)  Coronary Artery Disease  (ICD-414.00) 5)  Hair Loss  (ICD-704.00) 6)  Skin Lesion  (ICD-709.9) 7)  Preventive Health Care  (ICD-V70.0) 8)  Gerd  (ICD-530.81) 9)  Uri  (ICD-465.9) 10)  Back Pain  (ICD-724.5) 11)  Anxiety  (ICD-300.00) 12)  Skin Lesion  (ICD-709.9) 13)  Preventive Health Care  (ICD-V70.0) 14)  Diabetes Mellitus, Type I  (ICD-250.01) 15)  Hypothyroidism  (ICD-244.9)  Medications Prior to Update: 1)  Levoxyl 88 Mcg  Tabs (Levothyroxine Sodium) .Marland Kitchen.. 1po Once Daily 2)  Novolog 100 Unit/ml  Soln (Insulin Aspart) .... 35-40 Units Daily 3)  Losartan Potassium-Hctz 100-12.5 Mg Tabs  (Losartan Potassium-Hctz) .... One Tablet By Mouth Once Daily 4)  Aspir-Low 81 Mg Tbec (Aspirin) .... Hold 1 By Mouth Once Daily 5)  Multivitamins   Tabs (Multiple Vitamin) .Marland Kitchen.. 1 Tab By Mouth Once Daily 6)  Pravastatin Sodium 40 Mg Tabs (Pravastatin Sodium) .... Take One Tablet By Mouth Daily At Bedtime  Current Medications (verified): 1)  Levoxyl 88 Mcg  Tabs (Levothyroxine Sodium) .Marland Kitchen.. 1po Once Daily 2)  Novolog 100 Unit/ml  Soln (Insulin Aspart) .... 35-40 Units Daily 3)  Losartan Potassium-Hctz 100-12.5 Mg Tabs (Losartan Potassium-Hctz) .... One Tablet By Mouth Once Daily 4)  Aspir-Low 81 Mg Tbec (Aspirin) .... Hold 1 By Mouth Once Daily 5)  Multivitamins   Tabs (Multiple Vitamin) .Marland Kitchen.. 1 Tab By Mouth Once Daily 6)  Pravastatin Sodium 40 Mg Tabs (Pravastatin Sodium) .... Take One Tablet By Mouth Daily At Bedtime  Allergies (verified): 1)  ! Accupril 2)  ! Toprol Xl (Metoprolol Succinate) 3)  Cozaar 4)  Lipitor 5)  * Benicar  Past History:  Past Medical History: Last updated: 07/28/2009 HYPERLIPIDEMIA (ICD-272.4) HYPERTENSION (ICD-401.9) CORONARY ARTERY DISEASE (ICD-414.00) GERD (ICD-530.81) ANXIETY (ICD-300.00) DIABETES MELLITUS, TYPE I (ICD-250.01) - sees Dr Lucianne Muss HYPOTHYROIDISM (ICD-244.9)  Past Surgical History: Last updated: 03/29/2009 s/p coronary stent Coronary  artery bypass graft s/p c-section x 2  tubal ligation  Family History: Last updated: 11/30/2007 mother with HTN, melanoma grandmother with arthritis sister with ETOH abuse no dm  Social History: Last updated: 08/16/2009 Married Never Smoked Alcohol use-no 2 children 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         all otherwise negative per pt -    Physical Exam  General:  alert and overweight-appearing.   Head:  normocephalic and atraumatic.   Eyes:  vision grossly intact, pupils equal, and pupils round.   Ears:  R ear normal and L ear normal.   Nose:  no external deformity and no nasal discharge.   Mouth:  no gingival abnormalities and pharynx pink and moist.   Neck:  supple and no masses.   Lungs:  normal respiratory effort and normal breath sounds.   Heart:  normal rate and regular rhythm.   Abdomen:  soft, non-tender, and normal bowel sounds.   Msk:  no joint tenderness and no joint swelling.   Extremities:  no edema, no erythema  Neurologic:  cranial nerves II-XII intact and strength normal in all extremities.   Skin:  color normal and no rashes.   Psych:  memory intact for recent and remote and normally interactive.     Impression & Recommendations:  Problem # 1:  Preventive Health Care (ICD-V70.0)  Overall doing well, age appropriate education and counseling updated and referral for appropriate preventive services done unless declined, immunizations up to date or declined, diet counseling done if overweight, urged to quit smoking if smokes , most recent labs reviewed and current ordered if appropriate, ecg reviewed or declined (interpretation per ECG scanned in the EMR if done); information regarding Medicare Prevention requirements given if appropriate; speciality referrals updated as appropriate    Orders: TLB-Hepatic/Liver Function Pnl (80076-HEPATIC) TLB-CBC Platelet - w/Differential (85025-CBCD)  Problem # 2:  HYPERTENSION (ICD-401.9)  Her updated medication list for this problem includes:    Losartan Potassium-hctz 100-12.5 Mg Tabs (Losartan potassium-hctz) ..... One tablet by mouth once daily  BP today: 160/78 Prior BP: 160/60 (07/28/2009)  Labs Reviewed: K+: 4.4 (08/03/2009) Creat: : 0.8 (08/03/2009)   Chol: 201 (02/17/2009)   HDL: 70 (02/17/2009)   LDL: 117 (02/17/2009)   TG: 69 (02/17/2009) persistently elev and I think she should add another agent, but she declines at this time  Problem # 3:  HYPERLIPIDEMIA (ICD-272.4)  Her updated medication list for this problem includes:    Pravastatin Sodium 40 Mg Tabs (Pravastatin sodium) .Marland Kitchen... Take one tablet by mouth daily at bedtime has not yet started   - I have encouraged her to do so  Labs Reviewed: SGOT: 21 (09/13/2008)   SGPT: 20 (09/13/2008)   HDL:70 (02/17/2009), 63.20 (09/13/2008)  LDL:117 (02/17/2009), DEL (07/22/2007)  Chol:201 (02/17/2009), 226 (09/13/2008)  Trig:69 (02/17/2009), 93.0 (09/13/2008)  Problem # 4:  DIABETES MELLITUS, TYPE I (ICD-250.01)  Her updated  medication list for this problem includes:    Novolog 100 Unit/ml Soln (Insulin aspart) .Marland KitchenMarland KitchenMarland KitchenMarland Kitchen 35-40 units daily    Losartan Potassium-hctz 100-12.5 Mg Tabs (Losartan potassium-hctz) ..... One tablet by mouth once daily    Aspir-low 81 Mg Tbec (Aspirin) ..... Hold 1 by mouth once daily  Labs Reviewed: Creat: 0.8 (08/03/2009)    Reviewed HgBA1c results: 7.0 (02/17/2009)  7.7 (09/13/2008) some uncontrolled recently - mild; for a1c today, Pt to cont DM diet, excercise, wt loss efforts  Orders: TLB-A1C / Hgb A1C (Glycohemoglobin) (83036-A1C) TLB-BMP (Basic Metabolic Panel-BMET) (80048-METABOL) TLB-Lipid Panel (80061-LIPID)  Complete Medication List: 1)  Levoxyl 88 Mcg Tabs (Levothyroxine sodium) .Marland Kitchen.. 1po once daily 2)  Novolog 100 Unit/ml Soln  (Insulin aspart) .... 35-40 units daily 3)  Losartan Potassium-hctz 100-12.5 Mg Tabs (Losartan potassium-hctz) .... One tablet by mouth once daily 4)  Aspir-low 81 Mg Tbec (Aspirin) .... Hold 1 by mouth once daily 5)  Multivitamins Tabs (Multiple vitamin) .Marland Kitchen.. 1 tab by mouth once daily 6)  Pravastatin Sodium 40 Mg Tabs (Pravastatin sodium) .... Take one tablet by mouth daily at bedtime  Other Orders: Pneumococcal Vaccine (97673) Admin 1st Vaccine (41937)  Patient Instructions: 1)  please call for yearly mammogram - [facility] on [address],  or [facility] on [address] 2)  please call if you would want the colonscopy 3)  you had the pneumonia shot today 4)  Please go to the Lab in the basement for your blood and/or urine tests today  5)  Please schedule a follow-up appointment in 6 months with: 6)  BMP prior to visit, ICD-9: 250.02 7)  Lipid Panel prior to visit, ICD-9: 8)  HbgA1C prior to visit, ICD-9:   Immunizations Administered:  Pneumonia Vaccine:    Vaccine Type: Pneumovax    Site: right deltoid    Mfr: GlaxoSmithKline    Dose: 0.5 ml    Route: IM    Given by: [name] CMA (AAMA)    Exp. Date: 02/03/2011    Lot #: [id number]    VIS given: 09/02/95 version given August 16, 2009.

## 2010-03-06 NOTE — Progress Notes (Signed)
  Phone Note Refill Request Message from:  Patient on August 16, 2009 2:50 PM  Refills Requested: Medication #1:  LEVOXYL 88 MCG  TABS 1po once daily   Dosage confirmed as above?Dosage Confirmed  Medication #2:  NOVOLOG 100 UNIT/ML  SOLN 35-40 units daily   Dosage confirmed as above?Dosage Confirmed Initial call taken by: Robin Ewing CMA (AAMA),  August 16, 2009 2:51 PM    Prescriptions: NOVOLOG 100 UNIT/ML  SOLN (INSULIN ASPART) 35-40 units daily  #20 [place] x 4   Entered by:   [name] CMA (AAMA)   Authorized by:   Corwin Levins MD   Signed by:   Scharlene Gloss CMA (AAMA) on 08/16/2009   Method used:   Print then Give to Patient   RxID:   [id number] LEVOXYL 88 MCG  TABS (LEVOTHYROXINE SODIUM) 1po once daily  #90 x 3   Entered by:   Scharlene Gloss CMA (AAMA)   Authorized by:   Corwin Levins MD   Signed by:   Scharlene Gloss CMA (AAMA) on 08/16/2009   Method used:   Print then Give to Patient   RxID:   [id number]

## 2010-03-08 NOTE — Miscellaneous (Signed)
Summary: Orders Update   Clinical Lists Changes  Orders: Added new Referral order of Endocrinology Referral (Endocrine) - Signed 

## 2010-03-08 NOTE — Assessment & Plan Note (Signed)
Summary: 6 MTH FU  STC   Vital Signs:  Patient profile:   65 year old female Height:      64 inches Weight:      169.38 pounds BMI:     29.18 O2 Sat:      97 % on Room air Temp:     98.5 degrees F oral Pulse rate:   88 / minute BP sitting:   130 / 80  (left arm) Cuff size:   regular  Vitals Entered By: [name] CMA (AAMA) (February 14, 2010 11:14 AM)  O2 Flow:  Room air CC: 6 month ROV/RE   CC:  6 month ROV/RE.  History of Present Illness: here to f/u; overall doing ok,  Pt denies CP, worsening sob, doe, wheezing, orthopnea, pnd, worsening LE edema, palps, dizziness or syncope,  Pt denies new neuro symptoms such as headache, facial or extremity weakness  Pt denies polydipsia, polyuria, or low sugar symptoms such as shakiness improved with eating.  Overall good compliance with meds, trying to follow low chol, DM diet, wt stable, little excercise however  CBG's in low 100's.  No fever, wt loss, night sweats, loss of appetite or other constitutional symptoms  Overall good compliance with meds, and good tolerability. , but did not actually start the statin, tyring to work with diet first, and has recently stopped the lis-hct due to cramps in the hand which have seemed to have resolved.  Did gain a few lbs since last visit over the holidays, diet really no change, but maybe less active overall.   Denies hypo or hyperthyroid symptoms such as voice, skin change.  Problems Prior to Update: 1)  Carotid Bruit  (ICD-785.9) 2)  Hyperlipidemia  (ICD-272.4) 3)  Hypertension  (ICD-401.9) 4)  Coronary Artery Disease  (ICD-414.00) 5)  Hair Loss  (ICD-704.00) 6)  Skin Lesion  (ICD-709.9) 7)  Preventive Health Care  (ICD-V70.0) 8)  Gerd  (ICD-530.81) 9)  Uri  (ICD-465.9) 10)  Back Pain  (ICD-724.5) 11)  Anxiety  (ICD-300.00) 12)  Skin Lesion  (ICD-709.9) 13)  Preventive Health Care  (ICD-V70.0) 14)  Diabetes Mellitus, Type I  (ICD-250.01) 15)  Hypothyroidism  (ICD-244.9)  Medications Prior  to Update: 1)  Levoxyl 88 Mcg  Tabs (Levothyroxine Sodium) .Marland Kitchen.. 1po Once Daily 2)  Novolog 100 Unit/ml  Soln (Insulin Aspart) .... 35-40 Units Daily 3)  Losartan Potassium-Hctz 100-12.5 Mg Tabs (Losartan Potassium-Hctz) .... One Tablet By Mouth Once Daily 4)  Aspir-Low 81 Mg Tbec (Aspirin) .... Hold 1 By Mouth Once Daily 5)  Multivitamins   Tabs (Multiple Vitamin) .Marland Kitchen.. 1 Tab By Mouth Once Daily 6)  Pravastatin Sodium 40 Mg Tabs (Pravastatin Sodium) .... Take One Tablet By Mouth Daily At Bedtime  Current Medications (verified): 1)  Levoxyl 88 Mcg  Tabs (Levothyroxine Sodium) .Marland Kitchen.. 1po Once Daily 2)  Novolog 100 Unit/ml  Soln (Insulin Aspart) .... 35-40 Units Daily 3)  Losartan Potassium 50 Mg Tabs (Losartan Potassium) .Marland Kitchen.. 1po Once Daily 4)  Aspir-Low 81 Mg Tbec (Aspirin) .... Hold 1 By Mouth Once Daily 5)  Multivitamins   Tabs (Multiple Vitamin) .Marland Kitchen.. 1 Tab By Mouth Once Daily 6)  Pravastatin Sodium 40 Mg Tabs (Pravastatin Sodium) .... Take One Tablet By Mouth Daily At Bedtime  Allergies (verified): 1)  ! Accupril 2)  ! 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   Head:  normocephalic and atraumatic.   Eyes:  vision grossly  intact, pupils equal, and pupils round.   Ears:  R ear normal and L ear normal.   Nose:  no external deformity and no nasal discharge.   Mouth:  no gingival abnormalities and pharynx pink and moist.   Neck:  supple and no masses.   Lungs:  normal respiratory effort and normal breath sounds.   Heart:  normal rate and regular rhythm.   Abdomen:  soft, non-tender, and normal bowel sounds.   Extremities:  no edema, no erythema    Impression & Recommendations:  Problem # 1:  DIABETES MELLITUS, TYPE I (ICD-250.01)  Her updated medication list for this problem includes:    Novolog 100 Unit/ml Soln (Insulin aspart) .Marland KitchenMarland KitchenMarland KitchenMarland Kitchen 35-40 units daily    Losartan Potassium 50 Mg Tabs (Losartan potassium) .Marland Kitchen... 1po once daily    Aspir-low 81 Mg Tbec (Aspirin) ..... Hold 1 by mouth once daily  Orders: TLB-A1C / Hgb A1C (Glycohemoglobin) (83036-A1C) TLB-BMP (Basic Metabolic Panel-BMET) (80048-METABOL) TLB-Lipid Panel (80061-LIPID)  Labs Reviewed: Creat: 0.7 (08/16/2009)    Reviewed HgBA1c results: 7.6 (08/16/2009)  7.0 (02/17/2009) stable overall by hx and exam, ok to continue meds/tx as is  - . Pt to cont DM diet, excercise, wt control efforts; to check labs today   Problem # 2:  HYPERTENSION (ICD-401.9)  Her updated medication list for this problem includes:    Losartan Potassium 50 Mg Tabs (Losartan potassium) .Marland Kitchen... 1po once daily  BP today: 130/80 Prior BP: 160/78 (08/16/2009)  Labs Reviewed: K+: 4.6 (08/16/2009) Creat: : 0.7 (08/16/2009)   Chol: 263 (08/16/2009)   HDL: 87.30 (08/16/2009)   LDL: 117 (02/17/2009)   TG: 88.0 (08/16/2009) ok to change to losart 50 once daily, to avoid low BP and cramps that may have been due to the HCT.  cont to monitor BP at home and next visit  Problem # 3:  HYPERLIPIDEMIA (ICD-272.4)  Her updated medication list for this problem includes:    Pravastatin Sodium 40 Mg Tabs (Pravastatin sodium) .Marland Kitchen... Take one tablet by mouth daily at bedtime  Labs  Reviewed: SGOT: 20 (08/16/2009)   SGPT: 19 (08/16/2009)   HDL:87.30 (08/16/2009), 70 (02/17/2009)  LDL:117 (02/17/2009), DEL (07/22/2007)  Chol:263 (08/16/2009), 201 (02/17/2009)  Trig:88.0 (08/16/2009), 69 (02/17/2009) stable overall by hx and exam, ok to continue meds/tx as is , Pt to continue diet efforts, good med tolerance; to check labs - goal LDL less than 70  -   Problem # 4:  HYPOTHYROIDISM (ICD-244.9)  Her updated medication list for this problem includes:    Levoxyl 88 Mcg Tabs (Levothyroxine sodium) .Marland Kitchen... 1po once daily  Orders: TLB-TSH (Thyroid Stimulating Hormone) (84443-TSH)  Labs Reviewed: TSH: 0.57 (02/17/2009)    HgBA1c: 7.6 (08/16/2009) Chol: 263 (08/16/2009)   HDL: 87.30 (08/16/2009)   LDL: 117 (02/17/2009)   TG: 88.0 (08/16/2009) stable overall by hx and exam, ok to continue meds/tx  as is , to  check Lab today  Complete Medication List: 1)  Levoxyl 88 Mcg Tabs (Levothyroxine sodium) .Marland Kitchen.. 1po once daily 2)  Novolog 100 Unit/ml Soln (Insulin aspart) .... 35-40 units daily 3)  Losartan Potassium 50 Mg Tabs (Losartan potassium) .Marland Kitchen.. 1po once daily 4)  Aspir-low 81 Mg Tbec (Aspirin) .... Hold 1 by mouth once daily 5)  Multivitamins Tabs (Multiple vitamin) .Marland Kitchen.. 1 tab by mouth once daily 6)  Pravastatin Sodium 40 Mg Tabs (Pravastatin sodium) .... Take one tablet by mouth daily at bedtime  Patient Instructions: 1)  Please take all new medications as prescribed  - the cholesterol medication, and the losartan 2)  Continue all other previous medications as before this visit  3)  Please go to the Lab in the basement for your blood and/or urine tests today 4)  Please call the number on the [facility] Card for results of your testing  5)  Please schedule a follow-up appointment in 6 months for CPX with labs and: 6)  HbgA1C prior to visit, ICD-9: 250.02 7)  Urine Microalbumin prior to visit, ICD-9: Prescriptions: PRAVASTATIN SODIUM 40 MG TABS (PRAVASTATIN SODIUM) Take one tablet  by mouth daily at bedtime  #90 x 3   Entered and Authorized by:   Corwin Levins MD   Signed by:   Corwin Levins MD on 02/14/2010   Method used:   Print then Give to Patient   RxID:   [id number] QMVHQION POTASSIUM 50 MG TABS (LOSARTAN POTASSIUM) 1po once daily  #90 x 3   Entered and Authorized by:   Corwin Levins MD   Signed by:   Corwin Levins MD on 02/14/2010   Method used:   Print then Give to Patient   RxID:   [id number] NOVOLOG 100 UNIT/ML  SOLN (INSULIN ASPART) 35-40 units daily  #20 mill x 4   Entered and Authorized by:   Corwin Levins MD   Signed by:   Corwin Levins MD on 02/14/2010   Method used:   Print then Give to Patient   RxID:   [id number] LEVOXYL 88 MCG  TABS (LEVOTHYROXINE SODIUM) 1po once daily  #90 x 3   Entered and Authorized by:   Corwin Levins MD   Signed by:   Corwin Levins MD on 02/14/2010   Method used:   Print then Give to Patient   RxID:   [id number]    Orders Added: 1)  TLB-A1C / Hgb A1C (Glycohemoglobin) [83036-A1C] 2)  TLB-BMP (Basic Metabolic Panel-BMET) [80048-METABOL] 3)  TLB-Lipid Panel [80061-LIPID] 4)  TLB-TSH (Thyroid Stimulating Hormone) [84443-TSH] 5)  Est. Patient Level IV [41660]

## 2010-03-08 NOTE — Progress Notes (Signed)
Summary: Start pravachol  Phone Note Call from Patient   Caller: Patient Call For: Corwin Levins MD Summary of Call: Patient called back and would like to start Pravachol as never filled. She said she would rather try it first before going to Lipitor. Also requested toI fax her labs to [phone]. Initial call taken by: Robin Ewing CMA Duncan Dull),  February 15, 2010 11:22 AM  Follow-up for Phone Call        this is not recommended at pravachol is not strong enough for her current cholesterol;  I would still recommend the lipitor Follow-up by: Corwin Levins MD,  February 15, 2010 12:06 PM  Additional Follow-up for Phone Call Additional follow up Details #1::        called pt. left msg. to call back Additional Follow-up by: Robin Ewing CMA Duncan Dull),  February 15, 2010 1:11 PM    Additional Follow-up for Phone Call Additional follow up Details #2::    Called pt. and she does not want to start on Lipitor but has a prescirption of Pravachol that she is going to fill and start on. She is requesting to have labs done in 4 to 6 weeks to recheck???? Follow-up by: [name] CMA Duncan Dull),  February 15, 2010 3:27 PM  Additional Follow-up for Phone Call Additional follow up Details #3:: Details for Additional Follow-up Action Taken: she has rx for lipitor too, but...  against my better judement, ok to start the pravachol as she had before, check labs in 4 wks      lipids 272.0     hepatic function panel  v58.69 Additional Follow-up by: Corwin Levins MD,  February 15, 2010 5:33 PM  pt informed, labs scheduled week of Feb 6th Margaret Pyle, [place]  February 19, 2010 10:00 AM

## 2010-03-08 NOTE — Medication Information (Signed)
Summary: Diabetics Supplies/[employer]  Diabetics Supplies/[employer]   Imported By: Sherian Rein 02/09/2010 12:36:05  _____________________________________________________________________  External Attachment:    Type:   Image     Comment:   External Document

## 2010-03-08 NOTE — Medication Information (Signed)
Summary: [employer] [facility]  [employer] [facility]   Imported By: Lester Lauderdale 02/20/2010 07:04:48  _____________________________________________________________________  External Attachment:    Type:   Image     Comment:   External Document

## 2010-03-12 ENCOUNTER — Other Ambulatory Visit: Payer: Self-pay

## 2010-03-14 NOTE — Letter (Signed)
Summary: Reather Littler MD  Reather Littler MD   Imported By: Sherian Rein 03/07/2010 12:29:44  _____________________________________________________________________  External Attachment:    Type:   Image     Comment:   External Document

## 2010-06-19 NOTE — Assessment & Plan Note (Signed)
[facility]                            CARDIOLOGY OFFICE NOTE   NAME:Cain, Stacy                          MRN:          [id number]  DATE:10/05/2007                            DOB:          Sep 30, 1945    Ms. Skowron is a very pleasant 65 year old female who has a past medical  history of coronary artery disease who presents to establish.  The  patient previously resided in [place].  In 2005, she had a stent  that apparently failed and underwent bypass surgery in December 2005.  She has had no cardiac problems since then.  I do not have records of  her LV function.  Note, she typically does not have dyspnea on exertion,  orthopnea, PND, pedal edema, palpitations, presyncope, syncope, or  exertional chest pain.  Because of her coronary artery disease,  hypertension, and history of hyperlipidemia, we were asked to further  evaluate.   MEDICATIONS:  1. NovoLog insulin.  2. Levoxyl 88 mcg p.o. daily.  3. Aspirin 81 mg p.o. daily.  4. Amlodipine 5 mg p.o. daily.   ALLERGIES:  She has an allergy to ACCUPRIL which apparently caused  throat swelling in the past.  She also has a history of intolerance to  TOPROL.   SOCIAL HISTORY:  She did not smoke.  She occasionally consumes a glass  of wine.   FAMILY HISTORY:  Negative for coronary artery disease.   PAST MEDICAL HISTORY:  Significant for diabetes mellitus for 35 years.  She also has a hypertension and history of mild hyperlipidemia.  She has  hypothyroidism.  She has a history of coronary artery disease status  post coronary artery bypass graft.  She has had previous C-section and  her tubal ligation.  She has occasional headaches.   REVIEW OF SYSTEMS:  She denies any headaches, fevers, or chills.  There  is no productive cough or hemoptysis.  There is no dysphagia or  odynophagia, melena or hematochezia.  There is no dysuria or hematuria.  There is no rash or seizure activity.  There is no  orthopnea, PND, or  pedal edema.  There is no claudication.  The remaining systems are  negative.   PHYSICAL EXAMINATION:  Today shows a blood pressure that is elevated at  176/91 and pulse is 87.  She weighs 172 pounds.  She is well developed  and well nourished in no acute distress.  Skin is warm and dry.  She  does not appear to be depressed.  There is no peripheral clubbing.  Her  back is normal.  Her HEENT is normal with normal eyelids.  Her neck is  supple with normal upstrokes bilaterally.  No bruits heard.  There is no  jugular venous distention and no thyromegaly is noted.  Her chest is  clear to auscultation.  Normal expansion.  Cardiovascular exam is  regular rhythm.  Normal S1 and S2.  There are no murmurs, rubs, or  gallops noted.  Abdominal exam is nontender, nondistended.  Positive  bowel sounds.  No hepatosplenomegaly.  No mass appreciated.  There is  no  abdominal bruit.  She has 2+ femoral pulses bilaterally and no bruits.  Extremities show no edema.  I can palpate no cords.  She has 2+ dorsalis  pedis pulses bilaterally.  Neurologic exam is grossly intact.   Her electrocardiogram shows a sinus rhythm at a rate of 85.  There are  no ST changes noted.   DIAGNOSES:  1. Coronary artery disease status post coronary artery bypass graft -      Ms. Mosco has not had chest pain or increased shortness of breath.      She will continue with medical therapy.  I will continue her      aspirin 81 mg p.o. daily.  We will also initiate a statin.  She is      not on an ACE inhibitor due to her history of throat swelling.  I      am also therefore hesitant to add an ARB.  We will control her      blood pressure with amlodipine for now and add additional      medications as indicated.  We discussed importance of diet and      exercise.  She does not smoke.  2. Hypertension - blood pressure is elevated today.  However, her      amlodipine was only recently initiated in the past 3 days  and she      has not taken it today.  She will track this and if her systolic      runs are greater than 130 or her diastolic runs are greater than      85, then we will increase this further.  We could also add Coreg in      the future.  3. History of hyperlipidemia - we will add Crestor 5 mg p.o. daily and      check lipids and liver in 6 weeks.  Our goal LDL will be less than      70.  4. Hypothyroidism - she will continue on her Levoxyl.  5. Diabetes mellitus - management per primary care physician.   We will see her back in 1 year.     Madolyn Frieze Jens Som, MD, [facility]  Electronically Signed    BSC/MedQ  DD: 10/05/2007  DT: 10/06/2007  Job #: [id number]   cc:   Corwin Levins, MD

## 2010-10-17 ENCOUNTER — Ambulatory Visit (INDEPENDENT_AMBULATORY_CARE_PROVIDER_SITE_OTHER): Payer: PRIVATE HEALTH INSURANCE | Admitting: Internal Medicine

## 2010-10-17 ENCOUNTER — Encounter: Payer: Self-pay | Admitting: Internal Medicine

## 2010-10-17 VITALS — BP 152/68 | HR 82 | Temp 98.5°F | Ht 64.0 in | Wt 169.0 lb

## 2010-10-17 DIAGNOSIS — Z0000 Encounter for general adult medical examination without abnormal findings: Secondary | ICD-10-CM

## 2010-10-17 DIAGNOSIS — E109 Type 1 diabetes mellitus without complications: Secondary | ICD-10-CM

## 2010-10-17 DIAGNOSIS — M5412 Radiculopathy, cervical region: Secondary | ICD-10-CM | POA: Insufficient documentation

## 2010-10-17 DIAGNOSIS — E039 Hypothyroidism, unspecified: Secondary | ICD-10-CM

## 2010-10-17 MED ORDER — LEVOTHYROXINE SODIUM 100 MCG PO TABS: 100.0000 ug | ORAL_TABLET | Freq: Every day | ORAL | Status: DC

## 2010-10-17 NOTE — Patient Instructions (Addendum)
OK to stop the armour thyroid Start the Synthroid 100 mcg per day (you may need to mention at the pharmacy you prefer the Brand Name) Continue all other medications as before You will be contacted regarding the referral for: Dr Everardo All Please consider return if the arm pain returns, to consider MRI for the neck Please go to LAB in the Basement for the blood and/or urine tests to be done  - to be done in 4 wks (not today) Please call the phone number [phone] (the PhoneTree System) for results of testing in 2-3 days;  When calling, simply dial the number, and when prompted enter the MRN number above (the Medical Record Number) and the # key, then the message should start.

## 2010-10-17 NOTE — Assessment & Plan Note (Signed)
Severe pain only for several days now improved to minor discomfort at best, pt adamant she thinks it is from the armour thyroid;   to f/u any worsening symptoms or concerns, d/w pt the possible option of MRI cervical

## 2010-10-17 NOTE — Assessment & Plan Note (Addendum)
Pt no longer wants to see Dr Lucianne Muss for personal reasons (did not elaborate);  Ok to refer to Dr Everardo All, Continue all other medications as before Lab Results  Component Value Date   HGBA1C 8.0* 02/14/2010

## 2010-10-18 ENCOUNTER — Encounter: Payer: Self-pay | Admitting: Internal Medicine

## 2010-10-18 NOTE — Progress Notes (Signed)
Subjective:    Patient ID: Stacy Cain, female    DOB: [birth year], 65 y.o.   MRN: [id number]  HPI  Here with 4 days onset right neck and UE pain, radicular type by description , burning without numbness or weakness that has since resolved, which she is convinced must have been due to the thyroid med (armour thyroid) since the pain improved about the time she stopped taking her med.  Doing well for the last 3 days, but here as she reported this to her endocrinologist, who declined to change her thyroid med due to her symptoms.  She is adamant she will not take the armour thryoid further.  Denies hyper or hypo thyroid symptoms such as voice, skin or hair change.  Pt denies chest pain, increased sob or doe, wheezing, orthopnea, PND, increased LE swelling, palpitations, dizziness or syncope.   Pt denies polydipsia, polyuria, or low sugar symptoms such as weakness or confusion improved with po intake.  Pt states overall good compliance with meds, trying to follow lower cholesterol, diabetic diet, wt overall stable but little exercise however.    Pt denies new neurological symptoms such as new headache, or facial or extremity weakness or numbness, other than the above.  Also stopped her cozaar and pravachol several months ago, still taking her insulin though.   Past Medical History  Diagnosis Date  . ANXIETY 10/28/2007  . BACK PAIN 12/23/2007  . CAROTID BRUIT 07/28/2009  . CORONARY ARTERY DISEASE 07/22/2007  . DIABETES MELLITUS, TYPE I 07/22/2007  . GERD 12/23/2007  . HAIR LOSS 02/17/2009  . HYPERLIPIDEMIA 07/22/2007  . HYPERTENSION 07/22/2007  . HYPOTHYROIDISM 07/22/2007  . SKIN LESION 10/27/2007  . URI 12/23/2007   Past Surgical History  Procedure Date  . S/p coronary stent   . Coronary artery bypass graft   . Cesarean section     x2  . Tubal ligation     reports that she has never smoked. She does not have any smokeless tobacco history on file. She reports that she does not drink alcohol or use  illicit drugs. family history includes Alcohol abuse in her sister; Arthritis in her maternal grandmother; Hypertension in her mother; and Melanoma in her mother. Allergies  Allergen Reactions  . Atorvastatin     REACTION: joint pains  . Losartan Potassium     REACTION: blurred vision  . Metoprolol Succinate     REACTION: trigger fingers, arm pain  . Olmesartan Medoxomil   . Quinapril Hcl     REACTION: throat swelling   Current Outpatient Prescriptions on File Prior to Visit  Medication Sig Dispense Refill  . insulin aspart (NOVOLOG) 100 UNIT/ML injection Inject 35-40 Units into the skin daily.        Marland Kitchen aspirin 81 MG tablet Take 81 mg by mouth daily.         Review of Systems Review of Systems  Constitutional: Negative for diaphoresis and unexpected weight change.  HENT: Negative for drooling and tinnitus.   Eyes: Negative for photophobia and visual disturbance.  Respiratory: Negative for choking and stridor.   Gastrointestinal: Negative for vomiting and blood in stool.  Genitourinary: Negative for hematuria and decreased urine volume.  Musculoskeletal: Negative for gait problem.  Skin: Negative for color change and wound.     Objective:   Physical Exam BP 152/68  Pulse 82  Temp(Src) 98.5 F (36.9 C) (Oral)  Ht 5\' 4"  (1.626 m)  Wt 169 lb (76.658 kg)  BMI 29.01 kg/m2  SpO2 96%  Physical Exam  VS noted Constitutional: Pt appears well-developed and well-nourished.  HENT: Head: Normocephalic.  Right Ear: External ear normal.  Left Ear: External ear normal.  Eyes: Conjunctivae and EOM are normal. Pupils are equal, round, and reactive to light.  Neck: Normal range of motion. Neck supple.  Cardiovascular: Normal rate and regular rhythm.   Pulmonary/Chest: Effort normal and breath sounds normal.  Abd:  Soft, NT, non-distended, + BS Neurological: Pt is alert. No cranial nerve deficit. motor/sens/dtr intact to UE's, gait intact Skin: Skin is warm. No erythema.  Psychiatric:  Pt behavior is normal. Thought content normal.         Assessment & Plan:

## 2010-10-18 NOTE — Assessment & Plan Note (Signed)
Ok to change to levothyroid per pt request, f/u TSH 4 wks

## 2011-01-15 ENCOUNTER — Ambulatory Visit: Payer: PRIVATE HEALTH INSURANCE | Admitting: Cardiology

## 2011-11-15 ENCOUNTER — Encounter: Payer: Self-pay | Admitting: Internal Medicine

## 2012-10-19 ENCOUNTER — Other Ambulatory Visit: Payer: Self-pay | Admitting: *Deleted

## 2012-10-19 DIAGNOSIS — E785 Hyperlipidemia, unspecified: Secondary | ICD-10-CM

## 2012-10-19 DIAGNOSIS — E039 Hypothyroidism, unspecified: Secondary | ICD-10-CM

## 2012-10-19 DIAGNOSIS — E109 Type 1 diabetes mellitus without complications: Secondary | ICD-10-CM

## 2012-10-20 ENCOUNTER — Other Ambulatory Visit (INDEPENDENT_AMBULATORY_CARE_PROVIDER_SITE_OTHER): Payer: PRIVATE HEALTH INSURANCE

## 2012-10-20 DIAGNOSIS — E109 Type 1 diabetes mellitus without complications: Secondary | ICD-10-CM

## 2012-10-20 DIAGNOSIS — E039 Hypothyroidism, unspecified: Secondary | ICD-10-CM

## 2012-10-20 DIAGNOSIS — E785 Hyperlipidemia, unspecified: Secondary | ICD-10-CM

## 2012-10-20 LAB — COMPREHENSIVE METABOLIC PANEL
ALT: 13 U/L (ref 0–35)
AST: 17 U/L (ref 0–37)
Creatinine, Ser: 0.8 mg/dL (ref 0.4–1.2)
Sodium: 137 mEq/L (ref 135–145)
Total Bilirubin: 0.3 mg/dL (ref 0.3–1.2)
Total Protein: 7.3 g/dL (ref 6.0–8.3)

## 2012-10-20 LAB — URINALYSIS
Hgb urine dipstick: NEGATIVE
Leukocytes, UA: NEGATIVE
Nitrite: NEGATIVE
Total Protein, Urine: NEGATIVE
pH: 6.5 (ref 5.0–8.0)

## 2012-10-20 LAB — HEMOGLOBIN A1C: Hgb A1c MFr Bld: 7.7 % — ABNORMAL HIGH (ref 4.6–6.5)

## 2012-10-20 LAB — LIPID PANEL
Cholesterol: 234 mg/dL — ABNORMAL HIGH (ref 0–200)
Total CHOL/HDL Ratio: 4
Triglycerides: 74 mg/dL (ref 0.0–149.0)

## 2012-10-20 LAB — LDL CHOLESTEROL, DIRECT: Direct LDL: 152.6 mg/dL

## 2012-10-20 LAB — MICROALBUMIN / CREATININE URINE RATIO: Microalb Creat Ratio: 0.6 mg/g (ref 0.0–30.0)

## 2012-10-20 LAB — T4, FREE: Free T4: 1.13 ng/dL (ref 0.60–1.60)

## 2012-10-22 ENCOUNTER — Encounter: Payer: Self-pay | Admitting: Endocrinology

## 2012-10-22 ENCOUNTER — Other Ambulatory Visit (INDEPENDENT_AMBULATORY_CARE_PROVIDER_SITE_OTHER): Payer: PRIVATE HEALTH INSURANCE | Admitting: *Deleted

## 2012-10-22 ENCOUNTER — Ambulatory Visit (INDEPENDENT_AMBULATORY_CARE_PROVIDER_SITE_OTHER): Payer: PRIVATE HEALTH INSURANCE | Admitting: Endocrinology

## 2012-10-22 VITALS — BP 136/68 | HR 86 | Temp 98.4°F | Resp 12 | Ht 64.0 in | Wt 162.7 lb

## 2012-10-22 DIAGNOSIS — E785 Hyperlipidemia, unspecified: Secondary | ICD-10-CM

## 2012-10-22 DIAGNOSIS — IMO0002 Reserved for concepts with insufficient information to code with codable children: Secondary | ICD-10-CM

## 2012-10-22 DIAGNOSIS — E039 Hypothyroidism, unspecified: Secondary | ICD-10-CM

## 2012-10-22 DIAGNOSIS — E1065 Type 1 diabetes mellitus with hyperglycemia: Secondary | ICD-10-CM

## 2012-10-22 DIAGNOSIS — I10 Essential (primary) hypertension: Secondary | ICD-10-CM

## 2012-10-22 DIAGNOSIS — Z23 Encounter for immunization: Secondary | ICD-10-CM

## 2012-10-22 NOTE — Progress Notes (Signed)
Patient ID: Stacy Cain, female   DOB: March 13, 1945, 67 y.o.   MRN: [id number]  Stacy Cain is an 67 y.o. female.   Reason for Appointment: Insulin Pump followup:   History of Present Illness   Diagnosis: Type 1 DIABETES MELITUS, date of diagnosis:    1974  DIABETES history: She has been on an insulin pump for several years with variable control. She is fairly good about monitoring her blood sugars and doing some adjustments of her pump settings but continues to have inconsistent readings usually. Her A1c has usually been stable between 7-7.4%  CURRENT insulin pump:  [employer]tronic  RECENT HISTORY: Blood sugars are not as well controlled and she is not checking blood sugars at home consistently Also her blood sugars appear to be quite variable especially midday and afternoon Blood sugar readings are overall higher on waking up after reducing her basal rate at midnight some weeks ago when she was having some tendency to low sugars. Also the blood sugars appear to be high until about lunchtime even with doing a correction Blood sugars are also high periodically later in the day and overall averaging nearly 200 at suppertime She thinks she did better with using the continuous glucose monitor but stopped doing this because of extra effort involved She also thinks she is not getting enough coverage for meals which is more evident at lunchtime when she is checking more postprandial readings  The pump SETTINGS are: Basal rate: 0.55 until 7 AM. 1.45 until 1 PM when the rate is 0.85. 9 PM = 0.75 Carb Ratio: 1: 12 Correction factor 1: 45 at 6 AM and 55 overnight  GLUCOSE CONTROL with the pump is assessed today by pump download. Fasting glucose recently 147-227, nonfasting 53-280 with only low blood sugar at about 7 PM and highest readings around 2 PM. Overall average 172, standard deviation 59, readings above target = 68%   Lab Results  Component Value Date   HGBA1C 7.7* 10/20/2012   Compared to the last  visit the diabetes is relatively worse, previous A1c 7.1 in 5/14  MICROALBUMIN has been tested, and the result is normal .  Appointment on 10/20/2012  Component Date Value Range Status  . Cholesterol 10/20/2012 234* 0 - 200 mg/dL Final   ATP III Classification       Desirable:  < 200 mg/dL               Borderline High:  200 - 239 mg/dL          High:  > = 563 mg/dL  . Triglycerides 10/20/2012 74.0  0.0 - 149.0 mg/dL Final   Normal:  <875 mg/dLBorderline High:  150 - 199 mg/dL  . HDL 10/20/2012 61.70  >39.00 mg/dL Final  . VLDL 64/33/2951 14.8  0.0 - 40.0 mg/dL Final  . Total CHOL/HDL Ratio 10/20/2012 4   Final                  Men          Women1/2 Average Risk     3.4          3.3Average Risk          5.0          4.42X Average Risk          9.6          7.13X Average Risk          15.0  11.0                      . Free T4 10/20/2012 1.13  0.60 - 1.60 ng/dL Final  . TSH 16/11/9602 0.43  0.35 - 5.50 uIU/mL Final  . Hemoglobin A1C 10/20/2012 7.7* 4.6 - 6.5 % Final   Glycemic Control Guidelines for People with Diabetes:Non Diabetic:  <6%Goal of Therapy: <7%Additional Action Suggested:  >8%   . Color, Urine 10/20/2012 LT. YELLOW  Yellow;Lt. Yellow Final  . APPearance 10/20/2012 CLEAR  Clear Final  . Specific Gravity, Urine 10/20/2012 1.015  1.000-1.030 Final  . pH 10/20/2012 6.5  5.0 - 8.0 Final  . Total Protein, Urine 10/20/2012 NEGATIVE  Negative Final  . Urine Glucose 10/20/2012 NEGATIVE  Negative Final  . Ketones, ur 10/20/2012 NEGATIVE  Negative Final  . Bilirubin Urine 10/20/2012 SMALL  Negative Final  . Hgb urine dipstick 10/20/2012 NEGATIVE  Negative Final  . Urobilinogen, UA 10/20/2012 0.2  0.0 - 1.0 Final  . Leukocytes, UA 10/20/2012 NEGATIVE  Negative Final  . Nitrite 10/20/2012 NEGATIVE  Negative Final  . Sodium 10/20/2012 137  135 - 145 mEq/L Final  . Potassium 10/20/2012 3.9  3.5 - 5.1 mEq/L Final  . Chloride 10/20/2012 101  96 - 112 mEq/L Final  . CO2 10/20/2012  28  19 - 32 mEq/L Final  . Glucose, Bld 10/20/2012 102* 70 - 99 mg/dL Final  . BUN 54/10/8117 14  6 - 23 mg/dL Final  . Creatinine, Ser 10/20/2012 0.8  0.4 - 1.2 mg/dL Final  . Total Bilirubin 10/20/2012 0.3  0.3 - 1.2 mg/dL Final  . Alkaline Phosphatase 10/20/2012 89  39 - 117 U/L Final  . AST 10/20/2012 17  0 - 37 U/L Final  . ALT 10/20/2012 13  0 - 35 U/L Final  . Total Protein 10/20/2012 7.3  6.0 - 8.3 g/dL Final  . Albumin 14/78/2956 3.9  3.5 - 5.2 g/dL Final  . Calcium 21/30/8657 9.3  8.4 - 10.5 mg/dL Final  . GFR 84/69/6295 73.92  >60.00 mL/min Final  . Microalb, Ur 10/20/2012 1.3  0.0 - 1.9 mg/dL Final  . Creatinine,U 28/41/3244 227.0   Final  . Microalb Creat Ratio 10/20/2012 0.6  0.0 - 30.0 mg/g Final  . Direct LDL 10/20/2012 152.6   Final   Optimal:  <100 mg/dLNear or Above Optimal:  100-129 mg/dLBorderline High:  130-159 mg/dLHigh:  160-189 mg/dLVery High:  >190 mg/dL      Medication List       This list is accurate as of: 10/22/12 10:31 AM.  Always use your most recent med list.               aspirin 81 MG tablet  Take 81 mg by mouth daily.     levothyroxine 88 MCG tablet  Commonly known as:  SYNTHROID, LEVOTHROID  Take 88 mcg by mouth daily before breakfast.     NOVOLOG 100 UNIT/ML injection  Generic drug:  insulin aspart  Inject 35-40 Units into the skin daily. Use with insulin pump        Allergies:  Allergies  Allergen Reactions  . Atorvastatin     REACTION: joint pains  . Losartan Potassium     REACTION: blurred vision  . Metoprolol Succinate     REACTION: trigger fingers, arm pain  . Olmesartan Medoxomil   . Quinapril Hcl     REACTION: throat swelling    Past Medical History  Diagnosis Date  .  ANXIETY 10/28/2007  . BACK PAIN 12/23/2007  . CAROTID BRUIT 07/28/2009  . CORONARY ARTERY DISEASE 07/22/2007  . DIABETES MELLITUS, TYPE I 07/22/2007  . GERD 12/23/2007  . HAIR LOSS 02/17/2009  . HYPERLIPIDEMIA 07/22/2007  . HYPERTENSION 07/22/2007   . HYPOTHYROIDISM 07/22/2007  . SKIN LESION 10/27/2007  . URI 12/23/2007    Past Surgical History  Procedure Laterality Date  . S/p coronary stent    . Coronary artery bypass graft    . Cesarean section      x2  . Tubal ligation      Family History  Problem Relation Age of Onset  . Hypertension Mother   . Melanoma Mother   . Alcohol abuse Sister   . Arthritis Maternal Grandmother     Social History:  reports that she has never smoked. She does not have any smokeless tobacco history on file. She reports that she does not drink alcohol or use illicit drugs.  REVIEW of systems:  HYPOTHYROIDISM: She has had long-standing primary hypothyroidism and previously has been on either Armour Thyroid or levothyroxine. She thinks she did not tolerate Armour Thyroid. Also was told on her last visit to take 6-1/2 tablets of levothyroxine 88 mcg per week but has taken 1 a day regularly. No palpitations or unusual fatigue and her TSH is again low normal, previously was 0.34  She has had borderline blood pressure readings at times but not on medications. No microalbuminuria  EXAM:  BP 136/68  Pulse 86  Temp(Src) 98.4 F (36.9 C)  Resp 12  Ht 5\' 4"  (1.626 m)  Wt 162 lb 11.2 oz (73.8 kg)  BMI 27.91 kg/m2  SpO2 97%  No ankle edema  ASSESSMENT:  DIABETES type 1:   Blood sugars are not as well controlled and she is not checking blood sugars at home is much She thinks she did better with using the continuous glucose monitor but stopped doing this because of extra effort involved She also has started seeing higher readings before breakfast after reducing her basal rate at midnight and she agrees to go up on the 7 AM basal rate which will be increased to 1.55.  She does have significant rise in blood sugars early-morning and blood sugars appear to be high until about lunchtime She also thinks she is not getting enough coverage for meals which is more evident at lunchtime when she is checking  more postprandial readings Carbohydrate coverage will be increased to 1:10 at least for breakfast  Hypercholesterolemia: Despite agreeing to take the medication on the last visit she still has not started this. She does have pravastatin at home. She simply does not want to take more medications and still not convinced of the importance of treating her lipids despite having both diabetes and history of CAD  HYPOTHYROIDISM: TSH is low normal, she will reduce her Sunday dose to half tablet and continue 1 tablet of 88 mcg on the other days  Counseling time over 50% of today's 25 minute visit  Craig Ionescu 10/22/2012, 10:31 AM

## 2012-10-22 NOTE — Patient Instructions (Addendum)
Start Pravastatin 40 mg daily  Change Settings as directed  1/2 tab on Sunday of Synthroid

## 2012-12-10 ENCOUNTER — Other Ambulatory Visit: Payer: Self-pay | Admitting: *Deleted

## 2012-12-10 MED ORDER — LEVOTHYROXINE SODIUM 88 MCG PO TABS: 88.0000 ug | ORAL_TABLET | Freq: Every day | ORAL | Status: DC

## 2013-01-19 ENCOUNTER — Other Ambulatory Visit: Payer: PRIVATE HEALTH INSURANCE

## 2013-01-21 ENCOUNTER — Ambulatory Visit: Payer: PRIVATE HEALTH INSURANCE | Admitting: Endocrinology

## 2013-02-11 ENCOUNTER — Other Ambulatory Visit: Payer: Self-pay | Admitting: *Deleted

## 2013-02-11 MED ORDER — INSULIN ASPART 100 UNIT/ML ~~LOC~~ SOLN: 35.0000 [IU] | Freq: Every day | SUBCUTANEOUS | Status: DC

## 2013-02-16 ENCOUNTER — Other Ambulatory Visit: Payer: PRIVATE HEALTH INSURANCE

## 2013-02-18 ENCOUNTER — Ambulatory Visit: Payer: PRIVATE HEALTH INSURANCE | Admitting: Endocrinology

## 2013-02-19 ENCOUNTER — Other Ambulatory Visit: Payer: PRIVATE HEALTH INSURANCE

## 2013-02-24 ENCOUNTER — Other Ambulatory Visit (INDEPENDENT_AMBULATORY_CARE_PROVIDER_SITE_OTHER): Payer: PRIVATE HEALTH INSURANCE

## 2013-02-24 DIAGNOSIS — E039 Hypothyroidism, unspecified: Secondary | ICD-10-CM

## 2013-02-24 DIAGNOSIS — IMO0002 Reserved for concepts with insufficient information to code with codable children: Secondary | ICD-10-CM

## 2013-02-24 DIAGNOSIS — E1065 Type 1 diabetes mellitus with hyperglycemia: Secondary | ICD-10-CM

## 2013-02-24 LAB — COMPREHENSIVE METABOLIC PANEL
ALBUMIN: 4 g/dL (ref 3.5–5.2)
ALK PHOS: 80 U/L (ref 39–117)
ALT: 15 U/L (ref 0–35)
AST: 22 U/L (ref 0–37)
BUN: 12 mg/dL (ref 6–23)
CO2: 25 mEq/L (ref 19–32)
Calcium: 9.2 mg/dL (ref 8.4–10.5)
Chloride: 105 mEq/L (ref 96–112)
Creatinine, Ser: 0.9 mg/dL (ref 0.4–1.2)
GFR: 66.32 mL/min (ref >60.00)
GLUCOSE: 60 mg/dL — AB (ref 70–99)
POTASSIUM: 4.5 meq/L (ref 3.5–5.1)
SODIUM: 137 meq/L (ref 135–145)
TOTAL PROTEIN: 7.5 g/dL (ref 6.0–8.3)
Total Bilirubin: 0.4 mg/dL (ref 0.3–1.2)

## 2013-02-24 LAB — LDL CHOLESTEROL, DIRECT: LDL DIRECT: 137.3 mg/dL

## 2013-02-24 LAB — LIPID PANEL
CHOL/HDL RATIO: 3
Cholesterol: 220 mg/dL — ABNORMAL HIGH (ref 0–200)
HDL: 70.7 mg/dL (ref >39.00)
Triglycerides: 57 mg/dL (ref 0.0–149.0)
VLDL: 11.4 mg/dL (ref 0.0–40.0)

## 2013-02-24 LAB — T4, FREE: Free T4: 1.12 ng/dL (ref 0.60–1.60)

## 2013-02-24 LAB — HEMOGLOBIN A1C: Hgb A1c MFr Bld: 8.1 % — ABNORMAL HIGH (ref 4.6–6.5)

## 2013-02-24 LAB — TSH: TSH: 0.98 u[IU]/mL (ref 0.35–5.50)

## 2013-03-03 ENCOUNTER — Encounter: Payer: Self-pay | Admitting: Endocrinology

## 2013-03-03 ENCOUNTER — Ambulatory Visit (INDEPENDENT_AMBULATORY_CARE_PROVIDER_SITE_OTHER): Payer: PRIVATE HEALTH INSURANCE | Admitting: Endocrinology

## 2013-03-03 VITALS — BP 146/72 | HR 85 | Temp 98.2°F | Resp 14 | Ht 64.0 in | Wt 165.7 lb

## 2013-03-03 DIAGNOSIS — E039 Hypothyroidism, unspecified: Secondary | ICD-10-CM

## 2013-03-03 DIAGNOSIS — E1065 Type 1 diabetes mellitus with hyperglycemia: Secondary | ICD-10-CM

## 2013-03-03 DIAGNOSIS — IMO0002 Reserved for concepts with insufficient information to code with codable children: Secondary | ICD-10-CM

## 2013-03-03 DIAGNOSIS — E785 Hyperlipidemia, unspecified: Secondary | ICD-10-CM

## 2013-03-03 NOTE — Progress Notes (Signed)
Patient ID: Stacy SplinterLinda Cain, female   DOB: 04/20/1945, 68 y.o.   MRN: [id number]   Reason for Appointment: Insulin Pump followup:   History of Present Illness   Diagnosis: Type 1 DIABETES MELITUS, date of diagnosis:  1974  DIABETES history: She has been on an insulin pump for several years with variable control. She is fairly good about monitoring her blood sugars and doing some adjustments of her pump settings but continues to have inconsistent readings usually. Her A1c has usually been stable between 7-7.4%  CURRENT insulin pump:  Medtronic   RECENT HISTORY: Blood sugars are again not well controlled  A1c has increased slightly This is despite increasing her morning basal rates on the last visit; she however did not increase her carbohydrate coverage for breakfast as directed GLUCOSE CONTROL with the pump is assessed today by pump download.  PREMEAL Breakfast Lunch Dinner Bedtime Overall  Glucose range:  57-292   78-238   166-211     Mean/median:  ~160     186   173+/-73    POST-MEAL PC Breakfast PC Lunch PC Dinner  Glucose range:  64-336    67-315   Mean/median:      Problems identified:  Variable readings in the mornings but over 200 on the last 3 mornings  She is checking her blood sugar somewhat sporadically during the day and often not right at mealtimes  Blood sugars are fairly consistently HIGH in the afternoon and also mostly high late evening  Difficult to establish which readings are postprandial as she is having frequent small carbohydrate intake throughout the day between 10 AM-10 PM  May not be getting adequate correction for high readings since frequently her sugars may be persistently high, especially recently   PUMP SETTINGS are: Basal rate: Midnight = 0.65; 7 AM = 1.55,  1 PM = 0.85 and  9 PM = 0.75  Carb Ratio: 1: 12 Correction factor 1: 45 at 6 AM and 55 overnight, target 110, active insulin 4 hours  Wt Readings from Last 3 Encounters:  03/03/13 165 lb 11.2 oz  (75.161 kg)  10/22/12 162 lb 11.2 oz (73.8 kg)  10/17/10 169 lb (76.658 kg)   A1c was 7.1 in 5/14  Lab Results  Component Value Date   HGBA1C 8.1* 02/24/2013   HGBA1C 7.7* 10/20/2012   HGBA1C 8.0* 02/14/2010   Lab Results  Component Value Date   MICROALBUR 1.3 10/20/2012   LDLCALC 117* 02/17/2009   CREATININE 0.9 02/24/2013     No visits with results within 1 Week(s) from this visit. Latest known visit with results is:  Appointment on 02/24/2013  Component Date Value Range Status  . Free T4 02/24/2013 1.12  0.60 - 1.60 ng/dL Final  . TSH 57/84/696201/21/2015 0.98  0.35 - 5.50 uIU/mL Final  . Hemoglobin A1C 02/24/2013 8.1* 4.6 - 6.5 % Final   Glycemic Control Guidelines for People with Diabetes:Non Diabetic:  <6%Goal of Therapy: <7%Additional Action Suggested:  >8%   . Cholesterol 02/24/2013 220* 0 - 200 mg/dL Final   ATP III Classification       Desirable:  < 200 mg/dL               Borderline High:  200 - 239 mg/dL          High:  > = 952240 mg/dL  . Triglycerides 02/24/2013 57.0  0.0 - 149.0 mg/dL Final   Normal:  <841<150 mg/dLBorderline High:  150 - 199 mg/dL  . HDL 02/24/2013  70.70  >39.00 mg/dL Final  . VLDL 40/98/1191 11.4  0.0 - 40.0 mg/dL Final  . Total CHOL/HDL Ratio 02/24/2013 3   Final                  Men          Women1/2 Average Risk     3.4          3.3Average Risk          5.0          4.42X Average Risk          9.6          7.13X Average Risk          15.0          11.0                      . Sodium 02/24/2013 137  135 - 145 mEq/L Final  . Potassium 02/24/2013 4.5  3.5 - 5.1 mEq/L Final  . Chloride 02/24/2013 105  96 - 112 mEq/L Final  . CO2 02/24/2013 25  19 - 32 mEq/L Final  . Glucose, Bld 02/24/2013 60* 70 - 99 mg/dL Final  . BUN 47/82/9562 12  6 - 23 mg/dL Final  . Creatinine, Ser 02/24/2013 0.9  0.4 - 1.2 mg/dL Final  . Total Bilirubin 02/24/2013 0.4  0.3 - 1.2 mg/dL Final  . Alkaline Phosphatase 02/24/2013 80  39 - 117 U/L Final  . AST 02/24/2013 22  0 - 37 U/L Final   . ALT 02/24/2013 15  0 - 35 U/L Final  . Total Protein 02/24/2013 7.5  6.0 - 8.3 g/dL Final  . Albumin 13/09/6576 4.0  3.5 - 5.2 g/dL Final  . Calcium 46/96/2952 9.2  8.4 - 10.5 mg/dL Final  . GFR 84/13/2440 66.32  >60.00 mL/min Final  . Direct LDL 02/24/2013 137.3   Final   Optimal:  <100 mg/dLNear or Above Optimal:  100-129 mg/dLBorderline High:  130-159 mg/dLHigh:  160-189 mg/dLVery High:  >190 mg/dL      Medication List       This list is accurate as of: 03/03/13  3:30 PM.  Always use your most recent med list.               aspirin 81 MG tablet  Take 81 mg by mouth daily.     insulin aspart 100 UNIT/ML injection  Commonly known as:  NOVOLOG  Inject 35-40 Units into the skin daily. Use with insulin pump     levothyroxine 88 MCG tablet  Commonly known as:  SYNTHROID, LEVOTHROID  Take 1 tablet (88 mcg total) by mouth daily before breakfast.        Allergies:  Allergies  Allergen Reactions  . Atorvastatin     REACTION: joint pains  . Losartan Potassium     REACTION: blurred vision  . Metoprolol Succinate     REACTION: trigger fingers, arm pain  . Olmesartan Medoxomil   . Quinapril Hcl     REACTION: throat swelling    Past Medical History  Diagnosis Date  . ANXIETY 10/28/2007  . BACK PAIN 12/23/2007  . CAROTID BRUIT 07/28/2009  . CORONARY ARTERY DISEASE 07/22/2007  . DIABETES MELLITUS, TYPE I 07/22/2007  . GERD 12/23/2007  . HAIR LOSS 02/17/2009  . HYPERLIPIDEMIA 07/22/2007  . HYPERTENSION 07/22/2007  . HYPOTHYROIDISM 07/22/2007  . SKIN LESION 10/27/2007  . URI 12/23/2007    Past Surgical  History  Procedure Laterality Date  . S/p coronary stent    . Coronary artery bypass graft    . Cesarean section      x2  . Tubal ligation      Family History  Problem Relation Age of Onset  . Hypertension Mother   . Melanoma Mother   . Alcohol abuse Sister   . Arthritis Maternal Grandmother     Social History:  reports that she has never smoked. She does not  have any smokeless tobacco history on file. She reports that she does not drink alcohol or use illicit drugs.  REVIEW of systems:  HYPOTHYROIDISM: She has had long-standing primary hypothyroidism and previously has been on either Armour Thyroid or levothyroxine. She thinks she did not tolerate Armour Thyroid.  On her last visit she was told on her last visit to take 6-1/2 tablets of levothyroxine 88 mcg per week still on 1 qd .  No palpitations or unusual fatigue and her TSH is:  Lab Results  Component Value Date   TSH 0.98 02/24/2013    She has had borderline blood pressure readings at times but not on medications; she is reluctant to take any. No microalbuminuria  EXAM:  Wt 165 lb 11.2 oz (75.161 kg)  No lower leg edema  ASSESSMENT:  DIABETES type 1:   Blood sugars are not as well controlled especially recently See history of present illness for detailed discussion of current patterns and problems identified More recently blood sugars are high most of the time except occasionally in the morning and late night and appears to need a large amount of basal. As discussed above she has frequent small boluses for meals and difficult to identify postprandial patterns She thinks she did better with using the continuous glucose monitor but stopped doing this because of extra effort involved Recommendations made today:  She will do the continuous glucose monitor for 2 weeks prior to her next visit in one month. This will help adjust her settings are basal and boluses  Increase basal rate between 7 AM-10 PM; 7 AM = 1.65, 1 PM = 1.0 and 10 PM = 0.75  Correction factor 1: 40 during the day  Carbohydrate coverage 1:10 for breakfast  More consistent glucose monitoring  Hypercholesterolemia: Despite numerous discussions on importance of statin drugs she still has not started treatment.  She does have pravastatin at home. She simply does not want to take more medications and still not  convinced of the importance of treating her lipids despite having both diabetes and history of CAD  HYPOTHYROIDISM: TSH is back to normal even with continuing the same dose of 88 mcg daily  Counseling time over 50% of today's 25 minute visit  Hallel Denherder 03/03/2013, 3:30 PM

## 2013-03-03 NOTE — Patient Instructions (Signed)
Change basals, wear sensor for 2 weeks prior to visit  Consider statin Rx Exercise  Check Bp weekly  No visits with results within 1 Week(s) from this visit. Latest known visit with results is:  Appointment on 02/24/2013  Component Date Value Range Status  . Free T4 02/24/2013 1.12  0.60 - 1.60 ng/dL Final  . TSH 09/81/191401/21/2015 0.98  0.35 - 5.50 uIU/mL Final  . Hemoglobin A1C 02/24/2013 8.1* 4.6 - 6.5 % Final   Glycemic Control Guidelines for People with Diabetes:Non Diabetic:  <6%Goal of Therapy: <7%Additional Action Suggested:  >8%   . Cholesterol 02/24/2013 220* 0 - 200 mg/dL Final   ATP III Classification       Desirable:  < 200 mg/dL               Borderline High:  200 - 239 mg/dL          High:  > = 782240 mg/dL  . Triglycerides 02/24/2013 57.0  0.0 - 149.0 mg/dL Final   Normal:  <956<150 mg/dLBorderline High:  150 - 199 mg/dL  . HDL 02/24/2013 70.70  >39.00 mg/dL Final  . VLDL 21/30/865701/21/2015 11.4  0.0 - 40.0 mg/dL Final  . Total CHOL/HDL Ratio 02/24/2013 3   Final                  Men          Women1/2 Average Risk     3.4          3.3Average Risk          5.0          4.42X Average Risk          9.6          7.13X Average Risk          15.0          11.0                      . Sodium 02/24/2013 137  135 - 145 mEq/L Final  . Potassium 02/24/2013 4.5  3.5 - 5.1 mEq/L Final  . Chloride 02/24/2013 105  96 - 112 mEq/L Final  . CO2 02/24/2013 25  19 - 32 mEq/L Final  . Glucose, Bld 02/24/2013 60* 70 - 99 mg/dL Final  . BUN 84/69/629501/21/2015 12  6 - 23 mg/dL Final  . Creatinine, Ser 02/24/2013 0.9  0.4 - 1.2 mg/dL Final  . Total Bilirubin 02/24/2013 0.4  0.3 - 1.2 mg/dL Final  . Alkaline Phosphatase 02/24/2013 80  39 - 117 U/L Final  . AST 02/24/2013 22  0 - 37 U/L Final  . ALT 02/24/2013 15  0 - 35 U/L Final  . Total Protein 02/24/2013 7.5  6.0 - 8.3 g/dL Final  . Albumin 28/41/324401/21/2015 4.0  3.5 - 5.2 g/dL Final  . Calcium 01/02/725301/21/2015 9.2  8.4 - 10.5 mg/dL Final  . GFR 66/44/034701/21/2015 66.32  >60.00 mL/min  Final  . Direct LDL 02/24/2013 137.3   Final   Optimal:  <100 mg/dLNear or Above Optimal:  100-129 mg/dLBorderline High:  130-159 mg/dLHigh:  160-189 mg/dLVery High:  >190 mg/dL

## 2013-03-31 ENCOUNTER — Ambulatory Visit: Payer: PRIVATE HEALTH INSURANCE | Admitting: Endocrinology

## 2013-04-26 ENCOUNTER — Ambulatory Visit: Payer: PRIVATE HEALTH INSURANCE | Admitting: Endocrinology

## 2013-05-31 ENCOUNTER — Ambulatory Visit: Payer: PRIVATE HEALTH INSURANCE | Admitting: Endocrinology

## 2013-06-04 ENCOUNTER — Other Ambulatory Visit (INDEPENDENT_AMBULATORY_CARE_PROVIDER_SITE_OTHER): Payer: PRIVATE HEALTH INSURANCE

## 2013-06-04 DIAGNOSIS — IMO0002 Reserved for concepts with insufficient information to code with codable children: Secondary | ICD-10-CM

## 2013-06-04 DIAGNOSIS — E1065 Type 1 diabetes mellitus with hyperglycemia: Secondary | ICD-10-CM

## 2013-06-04 LAB — COMPREHENSIVE METABOLIC PANEL
ALK PHOS: 86 U/L (ref 39–117)
ALT: 14 U/L (ref 0–35)
AST: 17 U/L (ref 0–37)
Albumin: 4 g/dL (ref 3.5–5.2)
BILIRUBIN TOTAL: 0.4 mg/dL (ref 0.3–1.2)
BUN: 11 mg/dL (ref 6–23)
CO2: 28 mEq/L (ref 19–32)
Calcium: 9.7 mg/dL (ref 8.4–10.5)
Chloride: 103 mEq/L (ref 96–112)
Creatinine, Ser: 0.8 mg/dL (ref 0.4–1.2)
GFR: 80.54 mL/min (ref >60.00)
Glucose, Bld: 61 mg/dL — ABNORMAL LOW (ref 70–99)
Potassium: 4.4 mEq/L (ref 3.5–5.1)
SODIUM: 138 meq/L (ref 135–145)
Total Protein: 7.3 g/dL (ref 6.0–8.3)

## 2013-06-04 LAB — MICROALBUMIN / CREATININE URINE RATIO
Creatinine,U: 264.1 mg/dL
MICROALB/CREAT RATIO: 0.6 mg/g (ref 0.0–30.0)
Microalb, Ur: 1.7 mg/dL (ref 0.0–1.9)

## 2013-06-04 LAB — URINALYSIS, ROUTINE W REFLEX MICROSCOPIC
Hgb urine dipstick: NEGATIVE
Ketones, ur: NEGATIVE
Nitrite: NEGATIVE
PH: 7.5 (ref 5.0–8.0)
RBC / HPF: NONE SEEN (ref 0–?)
SPECIFIC GRAVITY, URINE: 1.01 (ref 1.000–1.030)
Total Protein, Urine: NEGATIVE
Urine Glucose: NEGATIVE
Urobilinogen, UA: 0.2 (ref 0.0–1.0)

## 2013-06-04 LAB — HEMOGLOBIN A1C: Hgb A1c MFr Bld: 7.5 % — ABNORMAL HIGH (ref 4.6–6.5)

## 2013-06-09 ENCOUNTER — Ambulatory Visit (INDEPENDENT_AMBULATORY_CARE_PROVIDER_SITE_OTHER): Payer: PRIVATE HEALTH INSURANCE | Admitting: Endocrinology

## 2013-06-09 ENCOUNTER — Encounter: Payer: Self-pay | Admitting: Endocrinology

## 2013-06-09 VITALS — BP 114/72 | HR 80 | Temp 98.9°F | Resp 12 | Wt 169.0 lb

## 2013-06-09 DIAGNOSIS — E785 Hyperlipidemia, unspecified: Secondary | ICD-10-CM

## 2013-06-09 DIAGNOSIS — IMO0002 Reserved for concepts with insufficient information to code with codable children: Secondary | ICD-10-CM

## 2013-06-09 DIAGNOSIS — E1065 Type 1 diabetes mellitus with hyperglycemia: Secondary | ICD-10-CM

## 2013-06-09 DIAGNOSIS — E039 Hypothyroidism, unspecified: Secondary | ICD-10-CM

## 2013-06-09 NOTE — Patient Instructions (Signed)
May use temp basal of 50% for increased activity levels  May use extended bolus for higher fat meals plus extra units for fat

## 2013-06-09 NOTE — Progress Notes (Signed)
Patient ID: Stacy Cain, female   DOB: 03/17/1945, 68 y.o.   MRN: [id number]   Reason for Appointment: Insulin Pump followup:   History of Present Illness   Diagnosis: Type 1 DIABETES MELITUS, date of diagnosis:  1974  DIABETES history: She has been on an insulin pump for several years with variable control. She is fairly compliant with monitoring her blood sugars and doing some adjustments of her pump settings but continues to have variability in blood sugar  Her A1c has previously been stable between 7-7.4%  CURRENT insulin pump:  Medtronic   RECENT HISTORY: On her last visit her A1c was 8.1% and some of her basal rates were increased. Blood sugars are overall better controlled although she is going to have more hypoglycemia now A1c has improved She has not changed any basal rates even with getting more hypoglycemia Current blood sugar patterns:  Fasting blood sugars are overall fairly good although sporadically higher  Blood sugars are not being checked very often at lunchtime, has had 2 relatively high readings over 200 without doing her morning bolus for breakfast  Blood sugars are near-normal are slightly low late afternoon  Not always checking blood sugars at the time of her evening boluses  Sugars may be higher after her evening bolus especially late at night and she is not clear why.   GLUCOSE CONTROL with the pump is assessed today by pump download.  PREMEAL Breakfast Lunch Dinner Bedtime Overall  Glucose range:  82-167   70-280   72-232   58-361    Mean/median:      136+/-65    Problems identified:  Variable readings late evening and most likely is not able to calculate adequate boluses for some of her meals; only rarely using an extended bolus for 30 minutes  She is checking her blood sugar somewhat sporadically during the day and often before her meals  Has numerous boluses for meals and snacks throughout the day with carbohydrate intake occasionally over 200 g  per day  She thinks that occasionally her hypoglycemic episodes in the afternoon are often related to increased house work or going shopping but she does not compensate for this with temporary basal  Blood sugars are fairly consistently HIGH in the afternoon and also mostly high late evening  Difficult to establish which readings are postprandial as she is having frequent small carbohydrate intake throughout the day between 10 AM-10 PM  May not be getting excessive correction for high readings in the morning says she thinks her sugar drops low after the morning bolus especially the sugar is high   PUMP SETTINGS are: Basal rate: Midnight = 0.65; 7 AM = 1.65,  1 PM = 1.0 and  9 PM = 0.75  Carb Ratio: 1: 12 Correction factor 1: 45 at 6 AM and 55 overnight, target 110, active insulin 4 hours  Wt Readings from Last 3 Encounters:  06/09/13 169 lb (76.658 kg)  03/03/13 165 lb 11.2 oz (75.161 kg)  10/22/12 162 lb 11.2 oz (73.8 kg)     Lab Results  Component Value Date   HGBA1C 7.5* 06/04/2013   HGBA1C 8.1* 02/24/2013   HGBA1C 7.7* 10/20/2012   Lab Results  Component Value Date   MICROALBUR 1.7 06/04/2013   LDLCALC 117* 02/17/2009   CREATININE 0.8 06/04/2013     Appointment on 06/04/2013  Component Date Value Ref Range Status  . Hemoglobin A1C 06/04/2013 7.5* 4.6 - 6.5 % Final   Glycemic Control Guidelines for People  with Diabetes:Non Diabetic:  <6%Goal of Therapy: <7%Additional Action Suggested:  >8%   . Sodium 06/04/2013 138  135 - 145 mEq/L Final  . Potassium 06/04/2013 4.4  3.5 - 5.1 mEq/L Final  . Chloride 06/04/2013 103  96 - 112 mEq/L Final  . CO2 06/04/2013 28  19 - 32 mEq/L Final  . Glucose, Bld 06/04/2013 61* 70 - 99 mg/dL Final  . BUN 16/10/960405/02/2013 11  6 - 23 mg/dL Final  . Creatinine, Ser 06/04/2013 0.8  0.4 - 1.2 mg/dL Final  . Total Bilirubin 06/04/2013 0.4  0.3 - 1.2 mg/dL Final  . Alkaline Phosphatase 06/04/2013 86  39 - 117 U/L Final  . AST 06/04/2013 17  0 - 37 U/L Final   . ALT 06/04/2013 14  0 - 35 U/L Final  . Total Protein 06/04/2013 7.3  6.0 - 8.3 g/dL Final  . Albumin 54/09/811905/02/2013 4.0  3.5 - 5.2 g/dL Final  . Calcium 14/78/295605/02/2013 9.7  8.4 - 10.5 mg/dL Final  . GFR 21/30/865705/02/2013 80.54  >60.00 mL/min Final  . Microalb, Ur 06/04/2013 1.7  0.0 - 1.9 mg/dL Final  . Creatinine,U 84/69/629505/02/2013 264.1   Final   Verified by manual dilution.  . Microalb Creat Ratio 06/04/2013 0.6  0.0 - 30.0 mg/g Final  . Color, Urine 06/04/2013 YELLOW  Yellow;Lt. Yellow Final  . APPearance 06/04/2013 CLEAR  Clear Final  . Specific Gravity, Urine 06/04/2013 1.010  1.000-1.030 Final  . pH 06/04/2013 7.5  5.0 - 8.0 Final  . Total Protein, Urine 06/04/2013 NEGATIVE  Negative Final  . Urine Glucose 06/04/2013 NEGATIVE  Negative Final  . Ketones, ur 06/04/2013 NEGATIVE  Negative Final  . Bilirubin Urine 06/04/2013 SMALL* Negative Final  . Hgb urine dipstick 06/04/2013 NEGATIVE  Negative Final  . Urobilinogen, UA 06/04/2013 0.2  0.0 - 1.0 Final  . Leukocytes, UA 06/04/2013 TRACE* Negative Final  . Nitrite 06/04/2013 NEGATIVE  Negative Final  . WBC, UA 06/04/2013 3-6/hpf* 0-2/hpf Final  . RBC / HPF 06/04/2013 none seen  0-2/hpf Final      Medication List       This list is accurate as of: 06/09/13 11:05 AM.  Always use your most recent med list.               insulin aspart 100 UNIT/ML injection  Commonly known as:  NOVOLOG  Inject 35-40 Units into the skin daily. Use with insulin pump     levothyroxine 88 MCG tablet  Commonly known as:  SYNTHROID, LEVOTHROID  Take 1 tablet (88 mcg total) by mouth daily before breakfast.        Allergies:  Allergies  Allergen Reactions  . Atorvastatin     REACTION: joint pains  . Losartan Potassium     REACTION: blurred vision  . Metoprolol Succinate     REACTION: trigger fingers, arm pain  . Olmesartan Medoxomil   . Quinapril Hcl     REACTION: throat swelling    Past Medical History  Diagnosis Date  . ANXIETY 10/28/2007  . BACK  PAIN 12/23/2007  . CAROTID BRUIT 07/28/2009  . CORONARY ARTERY DISEASE 07/22/2007  . DIABETES MELLITUS, TYPE I 07/22/2007  . GERD 12/23/2007  . HAIR LOSS 02/17/2009  . HYPERLIPIDEMIA 07/22/2007  . HYPERTENSION 07/22/2007  . HYPOTHYROIDISM 07/22/2007  . SKIN LESION 10/27/2007  . URI 12/23/2007    Past Surgical History  Procedure Laterality Date  . S/p coronary stent    . Coronary artery bypass graft    .  Cesarean section      x2  . Tubal ligation      Family History  Problem Relation Age of Onset  . Hypertension Mother   . Melanoma Mother   . Alcohol abuse Sister   . Arthritis Maternal Grandmother     Social History:  reports that she has never smoked. She does not have any smokeless tobacco history on file. She reports that she does not drink alcohol or use illicit drugs.  REVIEW of systems:  HYPOTHYROIDISM: She has had long-standing primary hypothyroidism and previously has been on either Armour Thyroid or levothyroxine. She thinks she did not tolerate Armour Thyroid.  On her last visit she was told on her last visit to take 6-1/2 tablets of levothyroxine 88 mcg per week still on 1 qd .  No palpitations or unusual fatigue and her TSH is:  Lab Results  Component Value Date   TSH 0.98 02/24/2013    She has had borderline blood pressure readings at times but not on medications; she is reluctant to take any. No microalbuminuria  EXAM:  BP 114/72  Pulse 80  Temp(Src) 98.9 F (37.2 C) (Oral)  Resp 12  Wt 169 lb (76.658 kg)  SpO2 97%  No lower leg edema  ASSESSMENT:  DIABETES type 1:   Blood sugars are improved over the last visit although still not optimal See history of present illness for detailed discussion of current blood glucose patterns, day-to-day management and problems identified She appears to need better coverage of her evening meal with checking her blood sugar before eating as well as adjusting boluses based on type of foods including high-fat  meals  Recommendations made today:  Continue same basal rates  Consider increasing carbohydrate ratio at suppertime  Consider using extended wave bolus  more often at suppertime and for longer periods  More blood sugars at lunchtime  Reduce sensitivity to 1:50 before lunch  May use temporary basal when planning to be active with housecleaning or shopping  Hypercholesterolemia: She is still reluctant to consider drug treatment despite hypercholesterolemia being consistently present and the benefits of statin drugs being explained  HYPOTHYROIDISM: TSH to be rechecked on the next visit  Counseling time over 50% of today's 25 minute visit  Reather LittlerAjay Elleanna Melling 06/09/2013, 11:05 AM

## 2013-06-29 ENCOUNTER — Other Ambulatory Visit: Payer: Self-pay | Admitting: *Deleted

## 2013-06-29 MED ORDER — LEVOTHYROXINE SODIUM 88 MCG PO TABS: 88.0000 ug | ORAL_TABLET | Freq: Every day | ORAL | Status: DC

## 2013-08-16 ENCOUNTER — Encounter: Payer: Self-pay | Admitting: Cardiology

## 2013-08-16 ENCOUNTER — Ambulatory Visit (INDEPENDENT_AMBULATORY_CARE_PROVIDER_SITE_OTHER): Payer: PRIVATE HEALTH INSURANCE | Admitting: Cardiology

## 2013-08-16 VITALS — BP 188/88 | HR 80 | Ht 64.0 in | Wt 168.2 lb

## 2013-08-16 DIAGNOSIS — R0989 Other specified symptoms and signs involving the circulatory and respiratory systems: Secondary | ICD-10-CM

## 2013-08-16 DIAGNOSIS — I10 Essential (primary) hypertension: Secondary | ICD-10-CM

## 2013-08-16 DIAGNOSIS — I2510 Atherosclerotic heart disease of native coronary artery without angina pectoris: Secondary | ICD-10-CM

## 2013-08-16 NOTE — Progress Notes (Signed)
HPI The patient presents as a new patient for me.  She has a history of CABG.  In 2005, she had a stent that apparently failed and underwent bypass surgery in December 2005.  She has not been seen in this office since 2001. At that time she had a nuclear study with an EF of 78%. She returns for followup.  She is very inactive but walks 18 stairs in her home.  The patient denies any new symptoms such as chest discomfort, neck or arm discomfort. There has been no new shortness of breath, PND or orthopnea. There have been no reported palpitations, presyncope or syncope.     Allergies  Allergen Reactions  . Atorvastatin     REACTION: joint pains  . Losartan Potassium     REACTION: blurred vision  . Metoprolol Succinate     REACTION: trigger fingers, arm pain  . Olmesartan Medoxomil   . Quinapril Hcl     REACTION: throat swelling    Current Outpatient Prescriptions  Medication Sig Dispense Refill  . calcipotriene-betamethasone (TACLONEX) ointment Apply topically 2 (two) times daily.      . insulin aspart (NOVOLOG) 100 UNIT/ML injection Inject 35-40 Units into the skin daily. Use with insulin pump  20 mL  5  . levothyroxine (SYNTHROID, LEVOTHROID) 88 MCG tablet Take 1 tablet (88 mcg total) by mouth daily before breakfast.  30 tablet  3  . urea (CARMOL) 40 % CREA Apply topically 2 (two) times daily.       No current facility-administered medications for this visit.    Past Medical History  Diagnosis Date  . ANXIETY 10/28/2007  . BACK PAIN 12/23/2007  . CAROTID BRUIT 07/28/2009  . CORONARY ARTERY DISEASE 07/22/2007  . DIABETES MELLITUS, TYPE I 07/22/2007  . GERD 12/23/2007  . HAIR LOSS 02/17/2009  . HYPERLIPIDEMIA 07/22/2007  . HYPERTENSION 07/22/2007  . HYPOTHYROIDISM 07/22/2007  . SKIN LESION 10/27/2007  . URI 12/23/2007    Past Surgical History  Procedure Laterality Date  . S/p coronary stent    . Coronary artery bypass graft    . Cesarean section      x2  . Tubal ligation       Family History  Problem Relation Age of Onset  . Hypertension Mother   . Melanoma Mother   . Alcohol abuse Sister   . Arthritis Maternal Grandmother     History   Social History  . Marital Status: Married    Spouse Name: N/A    Number of Children: 2  . Years of Education: N/A   Occupational History  . Diplomatic Services operational officer for [employer]    Social History Main Topics  . Smoking status: Never Smoker   . Smokeless tobacco: Not on file  . Alcohol Use: No  . Drug Use: No  . Sexual Activity: Not on file   Other Topics Concern  . Not on file   Social History Narrative  . No narrative on file    ROS:  Positive for shoulder pain. Otherwise negative for all other systems  PHYSICAL EXAM BP 188/88  Pulse 80  Ht 5\' 4"  (1.626 m)  Wt 168 lb 3 oz (76.289 kg)  BMI 28.85 kg/m2 GENERAL:  Well appearing HEENT:  Pupils equal round and reactive, fundi not visualized, oral mucosa unremarkable NECK:  No jugular venous distention, waveform within normal limits, carotid upstroke brisk and symmetric, no bruits, no thyromegaly LYMPHATICS:  No cervical, inguinal adenopathy LUNGS:  Clear to auscultation bilaterally BACK:  No CVA tenderness CHEST:  Well healed sternotomy scar. HEART:  PMI not displaced or sustained,S1 and S2 within normal limits, no S3, no S4, no clicks, no rubs, no murmurs ABD:  Flat, positive bowel sounds normal in frequency in pitch, no bruits, no rebound, no guarding, no midline pulsatile mass, no hepatomegaly, no splenomegaly EXT:  2 plus pulses throughout, no edema, no cyanosis no clubbing SKIN:  No rashes no nodules NEURO:  Cranial nerves II through XII grossly intact, motor grossly intact throughout PSYCH:  Cognitively intact, oriented to person place and time  Lab Results  Component Value Date   CHOL 220* 02/24/2013   TRIG 57.0 02/24/2013   HDL 70.70 02/24/2013   LDLCALC 117* 02/17/2009   LDLDIRECT 137.3 02/24/2013    EKG:  Sinus rhythm, rate 80, axis within  normal limits, intervals within normal limits, no acute ST-T wave changes.  08/16/2013   ASSESSMENT AND PLAN  CAD:   I will bring the patient back for a POET (Plain Old Exercise Test). This will allow me to screen for obstructive coronary disease, risk stratify and very importantly provide a prescription for exercise.  Unfortunately I think it will be difficult because of medication intolerances and her desire not to take medications to reduce her risk.  HTN:  She has not tolerated blood pressure medications. She will keep a blood pressure diary and consider taking a medication.  DM:   Her last A1C was 7.5.  I will defer to Oliver BarreJames John, MD.    DYSLIPIDEMIA:   Her lipids are listed above.  She was given a prescription by Dr. Lucianne MussKumar for a statin but she refuses to take it at this time although she will consider it.  CAROTID BRUIT:  She had a normal Doppler in 2011.  I reviewed this. No followup is needed.

## 2013-08-16 NOTE — Patient Instructions (Signed)
Your physician recommends that you schedule a follow-up appointment in: 6 months with Dr. Antoine PocheHochrein  We are scheduling a stress test

## 2013-08-20 ENCOUNTER — Telehealth (HOSPITAL_COMMUNITY): Payer: Self-pay

## 2013-08-20 NOTE — Telephone Encounter (Signed)
Encounter complete. 

## 2013-08-25 ENCOUNTER — Ambulatory Visit (HOSPITAL_COMMUNITY)
Admission: RE | Admit: 2013-08-25 | Discharge: 2013-08-25 | Disposition: A | Discharge status: Home / Self Care | Payer: PRIVATE HEALTH INSURANCE | Source: Ambulatory Visit | Attending: Internal Medicine | Admitting: Internal Medicine

## 2013-08-25 DIAGNOSIS — I2510 Atherosclerotic heart disease of native coronary artery without angina pectoris: Secondary | ICD-10-CM | POA: Insufficient documentation

## 2013-08-25 NOTE — Procedures (Signed)
Exercise Treadmill Test   Test  Exercise Tolerance Test Ordering MD: Rollene RotundaJames Hochrein MD  Interpreting MD:   Unique Test No: 1  Treadmill:  1  Indication for ETT: known ASHD  Contraindication to ETT: Yes   Stress Modality: exercise - treadmill  Cardiac Imaging Performed: non   Protocol: standard Bruce - maximal  Max BP:  209/71  Max MPHR (bpm):  153 85% MPR (bpm): 130  MPHR obtained (bpm):  133 % MPHR obtained:  86  Reached 85% MPHR (min:sec): 6:10 Total Exercise Time (min-sec):  6:18  Workload in METS:  7.40 Borg Scale:   Reason ETT Terminated:  fatigue    ST Segment Analysis At Rest: normal ST segments - no evidence of significant ST depression With Exercise: significant ischemic ST depression  Other Information Arrhythmia:  No Angina during ETT:  absent (0) Quality of ETT:  diagnostic  ETT Interpretation:  abnormal - evidence of ST depression consistent with ischemia  Comments: Significant 4 mm horizontal ST depression inferiorly and laterally at peak stress. ST segment changes persisted up to 10 minutes into recovery Marked hypertensive response to exercise Abnormal GXT suggestive of ischemia  Stacy NoseKenneth C. Hilty, MD, [facility]burgFACC Attending Cardiologist [facility]

## 2013-08-26 ENCOUNTER — Telehealth: Payer: Self-pay | Admitting: Internal Medicine

## 2013-08-26 NOTE — Telephone Encounter (Signed)
appt set with Dr. Katrinka BlazingSmith.

## 2013-08-26 NOTE — Telephone Encounter (Signed)
Ok with me, but would likely be more approp to see Dr Katrinka BlazingSmith - would she be ok with that?

## 2013-08-26 NOTE — Telephone Encounter (Signed)
Pt request to reestablish, last ov 10/2010. Pt request to be work in tomorrow Friday 08/27/13 due to shoulder pain. Please advise.

## 2013-08-26 NOTE — Telephone Encounter (Signed)
Left pt vm to call, got Dr. Katrinka BlazingSmith to work in tomorrow at 11:30am (if this is ok with pt).

## 2013-08-27 ENCOUNTER — Other Ambulatory Visit (INDEPENDENT_AMBULATORY_CARE_PROVIDER_SITE_OTHER): Payer: PRIVATE HEALTH INSURANCE

## 2013-08-27 ENCOUNTER — Ambulatory Visit (INDEPENDENT_AMBULATORY_CARE_PROVIDER_SITE_OTHER): Payer: PRIVATE HEALTH INSURANCE | Admitting: Family Medicine

## 2013-08-27 ENCOUNTER — Encounter: Payer: Self-pay | Admitting: Family Medicine

## 2013-08-27 VITALS — BP 130/82 | HR 89 | Ht 64.0 in | Wt 169.0 lb

## 2013-08-27 DIAGNOSIS — M25512 Pain in left shoulder: Secondary | ICD-10-CM

## 2013-08-27 DIAGNOSIS — M719 Bursopathy, unspecified: Secondary | ICD-10-CM

## 2013-08-27 DIAGNOSIS — M67919 Unspecified disorder of synovium and tendon, unspecified shoulder: Secondary | ICD-10-CM

## 2013-08-27 DIAGNOSIS — M25519 Pain in unspecified shoulder: Secondary | ICD-10-CM

## 2013-08-27 DIAGNOSIS — M7552 Bursitis of left shoulder: Secondary | ICD-10-CM | POA: Insufficient documentation

## 2013-08-27 MED ORDER — MELOXICAM 15 MG PO TABS: 15.0000 mg | ORAL_TABLET | Freq: Every day | ORAL | Status: DC

## 2013-08-27 NOTE — Progress Notes (Signed)
[facility] [address] [place], [place] [address] Phone: [phone] Subjective:    I'm seeing this patient by the request  of:  Oliver Barre, MD   CC: Left shoulder pain  [id number] Stacy Cain is a 68 y.o. female coming in with complaint of shoulder pain. Patient has had this pain for multiple months. Patient states that this is her nondominant arm. His upper limb or any true injury but did notice the first initial pain back in November when she tried to reach back behind the passenger seat in her car. Patient stated was more of a pulling sensation. Patient states that she is a dull aching pain and seems to be intermittent. States that it is worse with certain movements. Denies any radiation of pain and denies any numbness. Denies that it is associated with her neck. No weakness of the arm. States that the pain can wake her up at night and puts the severity of 6/10.     Past medical history, social, surgical and family history all reviewed in electronic medical record.   Review of Systems: No headache, visual changes, nausea, vomiting, diarrhea, constipation, dizziness, abdominal pain, skin rash, fevers, chills, night sweats, weight loss, swollen lymph nodes, body aches, joint swelling, muscle aches, chest pain, shortness of breath, mood changes.   Objective Blood pressure 130/82, pulse 89, height 5\' 4"  (1.626 m), weight 169 lb (76.658 kg), SpO2 98.00%.  General: No apparent distress alert and oriented x3 mood and affect normal, dressed appropriately.  HEENT: Pupils equal, extraocular movements intact  Respiratory: Patient's speak in full sentences and does not appear short of breath  Cardiovascular: No lower extremity edema, non tender, no erythema  Skin: Warm dry intact with no signs of infection or rash on extremities or on axial skeleton.  Abdomen: Soft nontender  Neuro: Cranial nerves II through XII are intact, neurovascularly intact in all  extremities with 2+ DTRs and 2+ pulses.  Lymph: No lymphadenopathy of posterior or anterior cervical chain or axillae bilaterally.  Gait normal with good balance and coordination.  MSK:  Non tender with full range of motion and good stability and symmetric strength and tone of elbows, wrist, hip, knee and ankles bilaterally.  Shoulder: left Inspection reveals no abnormalities, atrophy or asymmetry. Palpation is normal with no tenderness over AC joint or bicipital groove. ROM is full in all planes passively. Rotator cuff strength normal throughout. signs of impingement with positive Neer and Hawkin's tests, but negative empty can sign. Speeds and Yergason's tests normal. No labral pathology noted with negative Obrien's, negative clunk and good stability. Normal scapular function observed. No painful arc and no drop arm sign. No apprehension sign  MSK US performed of: left This study was ordered, performed, and interpreted by Terrilee Files D.O.  Shoulder:   Supraspinatus:  Appears normal on long and transverse views, Bursal bulge seen with shoulder abduction on impingement view. Infraspinatus:  Appears normal on long and transverse views. Significant increase in Doppler flow Subscapularis:  Does have calcific changes at its insertion. Appears to be a healing tear. No retraction noted Positive bursa Teres Minor:  Appears normal on long and transverse views. AC joint:  Capsule undistended, no geyser sign. Glenohumeral Joint:  Appears normal without effusion. Glenoid Labrum:  Intact without visualized tears. Biceps Tendon:  Appears normal on long and transverse views, no fraying of tendon, tendon located in intertubercular groove, no subluxation with shoulder internal or external rotation.  Impression: Subacromial bursitis with  calcific changes subscapularis.  Procedure: Real-time Ultrasound Guided Injection of left glenohumeral joint Device: GE Logiq E  Ultrasound guided injection is  preferred based studies that show increased duration, increased effect, greater accuracy, decreased procedural pain, increased response rate with ultrasound guided versus blind injection.  Verbal informed consent obtained.  Time-out conducted.  Noted no overlying erythema, induration, or other signs of local infection.  Skin prepped in a sterile fashion.  Local anesthesia: Topical Ethyl chloride.  With sterile technique and under real time ultrasound guidance:  Joint visualized.  23g 1  inch needle inserted posterior approach. Pictures taken for needle placement. Patient did have injection of 2 cc of 1% lidocaine, 2 cc of 0.5% Marcaine, and 1.0 cc of Kenalog 40 mg/dL. Completed without difficulty  Pain immediately resolved suggesting accurate placement of the medication.  Advised to call if fevers/chills, erythema, induration, drainage, or persistent bleeding.  Images permanently stored and available for review in the ultrasound unit.  Impression: Technically successful ultrasound guided injection.     Impression and Recommendations:     This case required medical decision making of moderate complexity.

## 2013-08-27 NOTE — Patient Instructions (Addendum)
Good to meet you Ice 20 minutes 2 times daily. Usually after activity and before bed. Exercises 3 times a week.  Meloixcam daily for 10 days then as needed.  Vitamin D 2000 IU daily.  Turmeric 500mg  twice daily could help.  Come back in 3 weeks.

## 2013-08-31 ENCOUNTER — Ambulatory Visit (INDEPENDENT_AMBULATORY_CARE_PROVIDER_SITE_OTHER): Payer: PRIVATE HEALTH INSURANCE | Admitting: Cardiology

## 2013-08-31 ENCOUNTER — Encounter: Payer: Self-pay | Admitting: Cardiology

## 2013-08-31 ENCOUNTER — Ambulatory Visit: Payer: PRIVATE HEALTH INSURANCE | Admitting: Family Medicine

## 2013-08-31 VITALS — BP 191/87 | HR 78 | Ht 64.0 in | Wt 166.7 lb

## 2013-08-31 DIAGNOSIS — I2510 Atherosclerotic heart disease of native coronary artery without angina pectoris: Secondary | ICD-10-CM

## 2013-08-31 DIAGNOSIS — I10 Essential (primary) hypertension: Secondary | ICD-10-CM

## 2013-08-31 NOTE — Progress Notes (Signed)
HPI The patient presents for follow up of CAD.  She had a stent that apparently failed and underwent bypass surgery in December 2005.  I saw her back recently after she has not been seen in our office for quite a while.  I did send her for a POET (Plain Old Exercise Treadmill) That was abnormal demonstrating 4 mm of ST segment depression laterally at peak exercise. This persisted for quite a while into recovery. She didn't have any chest pain with this. However, I suggested that she needed a cardiac catheterization given this abnormal result. She wanted to come back and talk about this first.   Allergies  Allergen Reactions  . Atorvastatin     REACTION: joint pains  . Losartan Potassium     REACTION: blurred vision  . Metoprolol Succinate     REACTION: trigger fingers, arm pain  . Olmesartan Medoxomil   . Quinapril Hcl     REACTION: throat swelling    Current Outpatient Prescriptions  Medication Sig Dispense Refill  . calcipotriene-betamethasone (TACLONEX) ointment Apply topically 2 (two) times daily.      . insulin aspart (NOVOLOG) 100 UNIT/ML injection Inject 35-40 Units into the skin daily. Use with insulin pump  20 mL  5  . levothyroxine (SYNTHROID, LEVOTHROID) 88 MCG tablet Take 1 tablet (88 mcg total) by mouth daily before breakfast.  30 tablet  3  . meloxicam (MOBIC) 15 MG tablet Take 1 tablet (15 mg total) by mouth daily.  30 tablet  0  . urea (CARMOL) 40 % CREA Apply topically 2 (two) times daily.       No current facility-administered medications for this visit.    Past Medical History  Diagnosis Date  . ANXIETY 10/28/2007  . BACK PAIN 12/23/2007  . CAROTID BRUIT 07/28/2009  . CORONARY ARTERY DISEASE 07/22/2007  . DIABETES MELLITUS, TYPE I 07/22/2007  . GERD 12/23/2007  . HAIR LOSS 02/17/2009  . HYPERLIPIDEMIA 07/22/2007  . HYPERTENSION 07/22/2007  . HYPOTHYROIDISM 07/22/2007  . SKIN LESION 10/27/2007  . URI 12/23/2007    Past Surgical History  Procedure Laterality  Date  . S/p coronary stent    . Coronary artery bypass graft    . Cesarean section      x2  . Tubal ligation      ROS:  Positive for shoulder pain. Otherwise negative for all other systems  PHYSICAL EXAM BP 191/87  Pulse 78  Ht 5\' 4"  (1.626 m)  Wt 166 lb 11.2 oz (75.615 kg)  BMI 28.60 kg/m2 GENERAL:  Well appearing NECK:  No jugular venous distention, waveform within normal limits, carotid upstroke brisk and symmetric, no bruits, no thyromegaly LUNGS:  Clear to auscultation bilaterally CHEST:  Well healed sternotomy scar. HEART:  PMI not displaced or sustained,S1 and S2 within normal limits, no S3, no S4, no clicks, no rubs, no murmurs ABD:  Flat, positive bowel sounds normal in frequency in pitch, no bruits, no rebound, no guarding, no midline pulsatile mass, no hepatomegaly, no splenomegaly EXT:  2 plus pulses throughout, no edema, no cyanosis no clubbing   ASSESSMENT AND PLAN  CAD:   I had a long discussion with the patient and husband about the positive POET (Plain Old Exercise Treadmill) Results in the risk of obstructive coronary disease. I have suggested cardiac catheterization. However, she does not want to have this. She will consider having stress testing and I described how this is probably going to leave [place] with some question but quit at  least risk statify. I would like to order a dobutamine echocardiogram.  HTN:  She has not tolerated blood pressure medications. We discussed at previous meetings eating a blood pressure diary and she might consider taking a blood pressure medicine future.  DM:   Her last A1C was 7.5.  I will defer to Oliver BarreJames John, MD.    DYSLIPIDEMIA:   Her lipids are listed above.  She was given a prescription by Dr. Lucianne MussKumar for a statin but she refuses to take it at this time although she will consider it.

## 2013-09-03 ENCOUNTER — Telehealth: Payer: Self-pay | Admitting: Cardiology

## 2013-09-03 ENCOUNTER — Other Ambulatory Visit: Payer: Self-pay | Admitting: *Deleted

## 2013-09-03 MED ORDER — INSULIN ASPART 100 UNIT/ML ~~LOC~~ SOLN: SUBCUTANEOUS | Status: DC

## 2013-09-03 NOTE — Telephone Encounter (Signed)
Please call,she said you were suppose to call her yesterday.

## 2013-09-03 NOTE — Telephone Encounter (Signed)
Talked to rhonda about Kelly SplinterLinda Wegman and the

## 2013-09-06 ENCOUNTER — Telehealth: Payer: Self-pay | Admitting: *Deleted

## 2013-09-06 NOTE — Telephone Encounter (Signed)
Talked to [place] and she is going to look into list matter and calll the pt with the information they need

## 2013-09-08 ENCOUNTER — Other Ambulatory Visit (INDEPENDENT_AMBULATORY_CARE_PROVIDER_SITE_OTHER): Payer: PRIVATE HEALTH INSURANCE

## 2013-09-08 DIAGNOSIS — IMO0002 Reserved for concepts with insufficient information to code with codable children: Secondary | ICD-10-CM

## 2013-09-08 DIAGNOSIS — E039 Hypothyroidism, unspecified: Secondary | ICD-10-CM

## 2013-09-08 DIAGNOSIS — E1065 Type 1 diabetes mellitus with hyperglycemia: Secondary | ICD-10-CM

## 2013-09-08 DIAGNOSIS — E785 Hyperlipidemia, unspecified: Secondary | ICD-10-CM

## 2013-09-08 LAB — COMPREHENSIVE METABOLIC PANEL
ALBUMIN: 4.1 g/dL (ref 3.5–5.2)
ALK PHOS: 74 U/L (ref 39–117)
ALT: 14 U/L (ref 0–35)
AST: 23 U/L (ref 0–37)
BILIRUBIN TOTAL: 0.6 mg/dL (ref 0.2–1.2)
BUN: 11 mg/dL (ref 6–23)
CO2: 24 mEq/L (ref 19–32)
Calcium: 9.3 mg/dL (ref 8.4–10.5)
Chloride: 103 mEq/L (ref 96–112)
Creatinine, Ser: 1.1 mg/dL (ref 0.4–1.2)
GFR: 54.82 mL/min — ABNORMAL LOW (ref >60.00)
GLUCOSE: 79 mg/dL (ref 70–99)
POTASSIUM: 4 meq/L (ref 3.5–5.1)
SODIUM: 135 meq/L (ref 135–145)
TOTAL PROTEIN: 7.4 g/dL (ref 6.0–8.3)

## 2013-09-08 LAB — TSH: TSH: 3.4 u[IU]/mL (ref 0.35–4.50)

## 2013-09-08 LAB — LIPID PANEL
CHOL/HDL RATIO: 2
Cholesterol: 193 mg/dL (ref 0–200)
HDL: 82.1 mg/dL (ref >39.00)
LDL CALC: 101 mg/dL — AB (ref 0–99)
NonHDL: 110.9
Triglycerides: 51 mg/dL (ref 0.0–149.0)
VLDL: 10.2 mg/dL (ref 0.0–40.0)

## 2013-09-08 LAB — HEMOGLOBIN A1C: HEMOGLOBIN A1C: 7.6 % — AB (ref 4.6–6.5)

## 2013-09-08 LAB — T4, FREE: Free T4: 0.92 ng/dL (ref 0.60–1.60)

## 2013-09-10 ENCOUNTER — Ambulatory Visit (INDEPENDENT_AMBULATORY_CARE_PROVIDER_SITE_OTHER): Payer: PRIVATE HEALTH INSURANCE | Admitting: Endocrinology

## 2013-09-10 ENCOUNTER — Encounter: Payer: Self-pay | Admitting: Endocrinology

## 2013-09-10 VITALS — BP 144/78 | HR 81 | Temp 97.9°F | Resp 16 | Ht 64.0 in | Wt 166.0 lb

## 2013-09-10 DIAGNOSIS — IMO0002 Reserved for concepts with insufficient information to code with codable children: Secondary | ICD-10-CM

## 2013-09-10 DIAGNOSIS — E1065 Type 1 diabetes mellitus with hyperglycemia: Secondary | ICD-10-CM

## 2013-09-10 DIAGNOSIS — E039 Hypothyroidism, unspecified: Secondary | ICD-10-CM

## 2013-09-10 DIAGNOSIS — E785 Hyperlipidemia, unspecified: Secondary | ICD-10-CM

## 2013-09-10 MED ORDER — SIMVASTATIN 40 MG PO TABS: 40.0000 mg | ORAL_TABLET | Freq: Every day | ORAL | Status: DC

## 2013-09-10 NOTE — Patient Instructions (Signed)
Temp basals if needed for persistent hi or lo sugars or exercise  May go to 0.7 or 0.75 at pm if bedtime readings > 150  Appointment on 09/08/2013  Component Date Value Ref Range Status  . Hemoglobin A1C 09/08/2013 7.6* 4.6 - 6.5 % Final   Glycemic Control Guidelines for People with Diabetes:Non Diabetic:  <6%Goal of Therapy: <7%Additional Action Suggested:  >8%   . Sodium 09/08/2013 135  135 - 145 mEq/L Final  . Potassium 09/08/2013 4.0  3.5 - 5.1 mEq/L Final  . Chloride 09/08/2013 103  96 - 112 mEq/L Final  . CO2 09/08/2013 24  19 - 32 mEq/L Final  . Glucose, Bld 09/08/2013 79  70 - 99 mg/dL Final  . BUN 82/95/621308/06/2013 11  6 - 23 mg/dL Final  . Creatinine, Ser 09/08/2013 1.1  0.4 - 1.2 mg/dL Final  . Total Bilirubin 09/08/2013 0.6  0.2 - 1.2 mg/dL Final  . Alkaline Phosphatase 09/08/2013 74  39 - 117 U/L Final  . AST 09/08/2013 23  0 - 37 U/L Final  . ALT 09/08/2013 14  0 - 35 U/L Final  . Total Protein 09/08/2013 7.4  6.0 - 8.3 g/dL Final  . Albumin 08/65/784608/06/2013 4.1  3.5 - 5.2 g/dL Final  . Calcium 96/29/528408/06/2013 9.3  8.4 - 10.5 mg/dL Final  . GFR 13/24/401008/06/2013 54.82* >60.00 mL/min Final  . Cholesterol 09/08/2013 193  0 - 200 mg/dL Final   ATP III Classification       Desirable:  < 200 mg/dL               Borderline High:  200 - 239 mg/dL          High:  > = 272240 mg/dL  . Triglycerides 09/08/2013 51.0  0.0 - 149.0 mg/dL Final   Normal:  <536<150 mg/dLBorderline High:  150 - 199 mg/dL  . HDL 09/08/2013 82.10  >39.00 mg/dL Final  . VLDL 64/40/347408/06/2013 10.2  0.0 - 40.0 mg/dL Final  . LDL Cholesterol 09/08/2013 101* 0 - 99 mg/dL Final  . Total CHOL/HDL Ratio 09/08/2013 2   Final                  Men          Women1/2 Average Risk     3.4          3.3Average Risk          5.0          4.42X Average Risk          9.6          7.13X Average Risk          15.0          11.0                      . NonHDL 09/08/2013 110.90   Final   NOTE:  Non-HDL goal should be 30 mg/dL higher than patient's LDL goal (i.e. LDL  goal of < 70 mg/dL, would have non-HDL goal of < 100 mg/dL)  . TSH 09/08/2013 3.40  0.35 - 4.50 uIU/mL Final  . Free T4 09/08/2013 0.92  0.60 - 1.60 ng/dL Final

## 2013-09-10 NOTE — Progress Notes (Signed)
Patient ID: Stacy Cain, female   DOB: 31-Jan-1946, 68 y.o.   MRN: [id number]   Reason for Appointment: Insulin Pump followup:   History of Present Illness   Diagnosis: Type 1 DIABETES MELITUS, date of diagnosis:  1974  DIABETES history: She has been on an insulin pump for several years with variable control. She is fairly compliant with monitoring her blood sugars and doing some adjustments of her pump settings but continues to have variability in blood sugar  Her A1c has previously been stable between 7-7.4%  CURRENT insulin pump:  Medtronic   RECENT HISTORY:  Her A1c appears to be stable around 7.5% She is sometimes adjusting her basal rates when she sees consistently lower readings and the only change appears to be 9 PM basal rate since her last visit However when she got a steroid injection in her shoulder about 2 weeks ago her blood sugars increased markedly to as much as 350 She did not change her basal rates much for this and was not able to  bring the sugars down with extra boluses Her blood sugars have finally started to improve in the last 5-7 days Did have a sporadic episode of high readings on Monday night which seemed to be better with changing her infusion set on Tuesday  More recently her blood sugars appear to be fairly good overall with only sporadic high readings May possibly be having high readings around bedtime Fasting readings appear to be near normal No recent overnight hypoglycemia or low sugars during the day with activity She is not doing any exercise at all  GLUCOSE CONTROL with the pump is assessed today by pump download.  Blood sugar analysis is affected by her hyperglycemia with steroids and details are not entered today Her recent blood sugar average is 172+/-56 at home   PUMP SETTINGS are: Basal rate: Midnight = 0.7; 7 AM = 1.6,  1 PM = 1.0 and  9 PM = 0.65  Carb Ratio: 1: 12 Correction factor 1: 45 at 6 AM and 55 overnight, target 110, active insulin 4  hours  Wt Readings from Last 3 Encounters:  09/10/13 166 lb (75.297 kg)  08/31/13 166 lb 11.2 oz (75.615 kg)  08/27/13 169 lb (76.658 kg)     Lab Results  Component Value Date   HGBA1C 7.6* 09/08/2013   HGBA1C 7.5* 06/04/2013   HGBA1C 8.1* 02/24/2013   Lab Results  Component Value Date   MICROALBUR 1.7 06/04/2013   LDLCALC 101* 09/08/2013   CREATININE 1.1 09/08/2013     Appointment on 09/08/2013  Component Date Value Ref Range Status  . Hemoglobin A1C 09/08/2013 7.6* 4.6 - 6.5 % Final   Glycemic Control Guidelines for People with Diabetes:Non Diabetic:  <6%Goal of Therapy: <7%Additional Action Suggested:  >8%   . Sodium 09/08/2013 135  135 - 145 mEq/L Final  . Potassium 09/08/2013 4.0  3.5 - 5.1 mEq/L Final  . Chloride 09/08/2013 103  96 - 112 mEq/L Final  . CO2 09/08/2013 24  19 - 32 mEq/L Final  . Glucose, Bld 09/08/2013 79  70 - 99 mg/dL Final  . BUN 30/86/5784 11  6 - 23 mg/dL Final  . Creatinine, Ser 09/08/2013 1.1  0.4 - 1.2 mg/dL Final  . Total Bilirubin 09/08/2013 0.6  0.2 - 1.2 mg/dL Final  . Alkaline Phosphatase 09/08/2013 74  39 - 117 U/L Final  . AST 09/08/2013 23  0 - 37 U/L Final  . ALT 09/08/2013 14  0 -  35 U/L Final  . Total Protein 09/08/2013 7.4  6.0 - 8.3 g/dL Final  . Albumin 86/57/846908/06/2013 4.1  3.5 - 5.2 g/dL Final  . Calcium 62/95/284108/06/2013 9.3  8.4 - 10.5 mg/dL Final  . GFR 32/44/010208/06/2013 54.82* >60.00 mL/min Final  . Cholesterol 09/08/2013 193  0 - 200 mg/dL Final   ATP III Classification       Desirable:  < 200 mg/dL               Borderline High:  200 - 239 mg/dL          High:  > = 725240 mg/dL  . Triglycerides 09/08/2013 51.0  0.0 - 149.0 mg/dL Final   Normal:  <366<150 mg/dLBorderline High:  150 - 199 mg/dL  . HDL 09/08/2013 82.10  >39.00 mg/dL Final  . VLDL 44/03/474208/06/2013 10.2  0.0 - 40.0 mg/dL Final  . LDL Cholesterol 09/08/2013 101* 0 - 99 mg/dL Final  . Total CHOL/HDL Ratio 09/08/2013 2   Final                  Men          Women1/2 Average Risk     3.4           3.3Average Risk          5.0          4.42X Average Risk          9.6          7.13X Average Risk          15.0          11.0                      . NonHDL 09/08/2013 110.90   Final   NOTE:  Non-HDL goal should be 30 mg/dL higher than patient's LDL goal (i.e. LDL goal of < 70 mg/dL, would have non-HDL goal of < 100 mg/dL)  . TSH 09/08/2013 3.40  0.35 - 4.50 uIU/mL Final  . Free T4 09/08/2013 0.92  0.60 - 1.60 ng/dL Final      Medication List       This list is accurate as of: 09/10/13 10:08 AM.  Always use your most recent med list.               calcipotriene-betamethasone ointment  Commonly known as:  TACLONEX  Apply topically 2 (two) times daily.     insulin aspart 100 UNIT/ML injection  Commonly known as:  NOVOLOG  Use with insulin pump 35-40 units per day     levothyroxine 88 MCG tablet  Commonly known as:  SYNTHROID, LEVOTHROID  Take 1 tablet (88 mcg total) by mouth daily before breakfast.     meloxicam 15 MG tablet  Commonly known as:  MOBIC  Take 1 tablet (15 mg total) by mouth daily.     urea 40 % Crea  Commonly known as:  CARMOL  Apply topically 2 (two) times daily.        Allergies:  Allergies  Allergen Reactions  . Atorvastatin     REACTION: joint pains  . Influenza Vaccines     Patient got bursitis after her vaccine  . Losartan Potassium     REACTION: blurred vision  . Metoprolol Succinate     REACTION: trigger fingers, arm pain  . Olmesartan Medoxomil   . Quinapril Hcl     REACTION: throat swelling    Past  Medical History  Diagnosis Date  . ANXIETY 10/28/2007  . BACK PAIN 12/23/2007  . CAROTID BRUIT 07/28/2009  . CORONARY ARTERY DISEASE 07/22/2007  . DIABETES MELLITUS, TYPE I 07/22/2007  . GERD 12/23/2007  . HAIR LOSS 02/17/2009  . HYPERLIPIDEMIA 07/22/2007  . HYPERTENSION 07/22/2007  . HYPOTHYROIDISM 07/22/2007  . SKIN LESION 10/27/2007  . URI 12/23/2007    Past Surgical History  Procedure Laterality Date  . S/p coronary stent    .  Coronary artery bypass graft    . Cesarean section      x2  . Tubal ligation      Family History  Problem Relation Age of Onset  . Hypertension Mother   . Melanoma Mother   . Alcohol abuse Sister   . Arthritis Maternal Grandmother     Social History:  reports that she has never smoked. She does not have any smokeless tobacco history on file. She reports that she does not drink alcohol or use illicit drugs.  REVIEW of systems:  HYPOTHYROIDISM: She has had long-standing primary hypothyroidism and previously has been on either Armour Thyroid or levothyroxine. She thinks she did not tolerate Armour Thyroid.  She has been taking 88 mcg regularly  No unusual fatigue and her TSH is:  Lab Results  Component Value Date   TSH 3.40 09/08/2013    She has had borderline blood pressure readings at times but not on medications; she is reluctant to take any because of side effects with some of the drugs she has taken. No microalbuminuria Blood pressure is high recently probably from anxiety about her cardiac evaluation  Recently had an abnormal stress test and is not willing to do a cardiac catheterization with potential stent  EXAM:  BP 160/74  Pulse 81  Temp(Src) 97.9 F (36.6 C)  Resp 16  Ht 5\' 4"  (1.626 m)  Wt 166 lb (75.297 kg)  BMI 28.48 kg/m2  SpO2 98%  She is somewhat anxious today. Repeat blood pressure 144/78   ASSESSMENT:  DIABETES type 1:   Blood sugars are overall about the same with A1c 7.5 consistently Control was affected in the last 2 weeks with a steroid injection She does not appear to be dealing with stress related hyperglycemia appropriately and sick day management was reviewed in detailed with her today  Recommendations made today:  Continue same basal rates but may need to increase 9 PM basal rate at bedtime readings consistently over 150  Consider using extended wave bolus  more often for high fat meals  Check blood sugar consistently 4 times a  day  May use a lower temporary basal when planning to be active   Use of basal rate of 20-50% more with acute illness or stress/steroids and also may need to double the bolus of months for high readings  Hypercholesterolemia: She is now willing to use a statin drug given though her LDL has improved She will go on Zocor 40 mg which she has tried before  ? Hypertension: Consider using Diovan his blood pressure consistently high. However she may need to be on beta blocker and will wait till she sees her new cardiologist for second opinion, recommendation made  HYPOTHYROIDISM: TSH is normal currently  Counseling time over 50% of today's 25 minute visit  Esgar Barnick 09/10/2013, 10:08 AM

## 2013-09-17 ENCOUNTER — Ambulatory Visit: Payer: PRIVATE HEALTH INSURANCE | Admitting: Family Medicine

## 2013-09-17 ENCOUNTER — Ambulatory Visit: Payer: PRIVATE HEALTH INSURANCE | Admitting: Internal Medicine

## 2013-10-20 ENCOUNTER — Encounter: Payer: Self-pay | Admitting: Internal Medicine

## 2013-10-20 ENCOUNTER — Ambulatory Visit (INDEPENDENT_AMBULATORY_CARE_PROVIDER_SITE_OTHER): Payer: PRIVATE HEALTH INSURANCE | Admitting: Internal Medicine

## 2013-10-20 ENCOUNTER — Other Ambulatory Visit (INDEPENDENT_AMBULATORY_CARE_PROVIDER_SITE_OTHER): Payer: PRIVATE HEALTH INSURANCE

## 2013-10-20 ENCOUNTER — Telehealth: Payer: Self-pay

## 2013-10-20 VITALS — BP 160/90 | HR 86 | Temp 99.4°F | Ht 64.0 in | Wt 163.4 lb

## 2013-10-20 DIAGNOSIS — R1013 Epigastric pain: Secondary | ICD-10-CM

## 2013-10-20 DIAGNOSIS — E785 Hyperlipidemia, unspecified: Secondary | ICD-10-CM

## 2013-10-20 DIAGNOSIS — K219 Gastro-esophageal reflux disease without esophagitis: Secondary | ICD-10-CM

## 2013-10-20 DIAGNOSIS — E039 Hypothyroidism, unspecified: Secondary | ICD-10-CM

## 2013-10-20 DIAGNOSIS — I10 Essential (primary) hypertension: Secondary | ICD-10-CM

## 2013-10-20 LAB — H. PYLORI ANTIBODY, IGG: H Pylori IgG: NEGATIVE

## 2013-10-20 MED ORDER — AMLODIPINE BESYLATE 5 MG PO TABS: 5.0000 mg | ORAL_TABLET | Freq: Every day | ORAL | Status: DC

## 2013-10-20 NOTE — Telephone Encounter (Signed)
Message copied by Pincus Sanes on Wed Oct 20, 2013  1:07 PM ------      Message from: Corwin Levins      Created: Wed Oct 20, 2013 12:03 PM      Regarding: pt concern       OK to continue to monitor for weight loss for now, and call for more than 5 lbs wt loss, as she would most likely need to see GI      ----- Message -----         From: Vladimir Crofts Ewing         Sent: 10/20/2013  11:36 AM           To: Corwin Levins, MD            The patient forgot to mention once taking acouple spoon fulls of food she feels full.  Advise as not able to eat a lot due to problem       ------

## 2013-10-20 NOTE — Progress Notes (Signed)
Pre visit review using our clinic review tool, if applicable. No additional management support is needed unless otherwise documented below in the visit note. 

## 2013-10-20 NOTE — Telephone Encounter (Signed)
Patient informed of response to her question

## 2013-10-20 NOTE — Patient Instructions (Signed)
OK to take the Prilosec OTC 20 mg per day  Please take all new medication as prescribed - the amlodipine 5 mg per day   Please monitor your blood pressure on a regular basis, as the goal is to be at least < 140/90  Please continue all other medications as before, and refills have been done if requested.  Please have the pharmacy call with any other refills you may need.  Please continue your efforts at being more active, low cholesterol diet, and weight control.  You are otherwise up to date with prevention measures today.  Please keep your appointments with your specialists as you may have planned  Please call or return if you change your mind about the flu shot, pneumonia shot and colonscopy  Please go to the LAB in the Basement (turn left off the elevator) for the tests to be done today - just the H pylori testing  You will be contacted by phone if any changes need to be made immediately.  Otherwise, you will receive a letter about your results with an explanation, but please check with MyChart first.  Please remember to sign up for MyChart if you have not done so, as this will be important to you in the future with finding out test results, communicating by private email, and scheduling acute appointments online when needed.

## 2013-10-20 NOTE — Progress Notes (Signed)
Subjective:    Patient ID: Stacy Cain, female    DOB: June 03, 1945, 68 y.o.   MRN: [id number]  HPI  Here to f/u; overall doing ok,  Pt denies chest pain, increased sob or doe, wheezing, orthopnea, PND, increased LE swelling, palpitations, dizziness or syncope.  Pt denies polydipsia, polyuria, or low sugar symptoms such as weakness or confusion improved with po intake.  Pt denies new neurological symptoms such as new headache, or facial or extremity weakness or numbness.   Pt states overall good compliance with meds, has been trying to follow lower cholesterol diet, with wt overall stable,  but little exercise however.  Has had worsening reflux with bloating and belching, but no abd pain, dysphagia, n/v, bowel change or blood. Declines immunizations and colonscopy,  Denies hyper or hypo thyroid symptoms such as voice, skin or hair change. Past Medical History  Diagnosis Date  . ANXIETY 10/28/2007  . BACK PAIN 12/23/2007  . CAROTID BRUIT 07/28/2009  . CORONARY ARTERY DISEASE 07/22/2007  . DIABETES MELLITUS, TYPE I 07/22/2007  . GERD 12/23/2007  . HAIR LOSS 02/17/2009  . HYPERLIPIDEMIA 07/22/2007  . HYPERTENSION 07/22/2007  . HYPOTHYROIDISM 07/22/2007  . SKIN LESION 10/27/2007  . URI 12/23/2007   Past Surgical History  Procedure Laterality Date  . S/p coronary stent    . Coronary artery bypass graft    . Cesarean section      x2  . Tubal ligation      reports that she has never smoked. She does not have any smokeless tobacco history on file. She reports that she does not drink alcohol or use illicit drugs. family history includes Alcohol abuse in her sister; Arthritis in her maternal grandmother; Hypertension in her mother; Melanoma in her mother. Allergies  Allergen Reactions  . Atorvastatin     REACTION: joint pains  . Influenza Vaccines     Patient got bursitis after her vaccine  . Losartan Potassium     REACTION: blurred vision  . Metoprolol Succinate     REACTION: trigger fingers,  arm pain  . Olmesartan Medoxomil   . Quinapril Hcl     REACTION: throat swelling   Current Outpatient Prescriptions on File Prior to Visit  Medication Sig Dispense Refill  . calcipotriene-betamethasone (TACLONEX) ointment Apply topically 2 (two) times daily.      . insulin aspart (NOVOLOG) 100 UNIT/ML injection Use with insulin pump 35-40 units per day  20 mL  3  . levothyroxine (SYNTHROID, LEVOTHROID) 88 MCG tablet Take 1 tablet (88 mcg total) by mouth daily before breakfast.  30 tablet  3  . meloxicam (MOBIC) 15 MG tablet Take 1 tablet (15 mg total) by mouth daily.  30 tablet  0  . simvastatin (ZOCOR) 40 MG tablet Take 1 tablet (40 mg total) by mouth at bedtime.  30 tablet  3  . urea (CARMOL) 40 % CREA Apply topically 2 (two) times daily.       No current facility-administered medications on file prior to visit.    Review of Systems  Constitutional: Negative for unusual diaphoresis or other sweats  HENT: Negative for ringing in ear Eyes: Negative for double vision or worsening visual disturbance.  Respiratory: Negative for choking and stridor.   Gastrointestinal: Negative for vomiting or other signifcant bowel change Genitourinary: Negative for hematuria or decreased urine volume.  Musculoskeletal: Negative for other MSK pain or swelling Skin: Negative for color change and worsening wound.  Neurological: Negative for tremors and numbness other than  noted  Psychiatric/Behavioral: Negative for decreased concentration or agitation other than above       Objective:   Physical Exam BP 160/90  Pulse 86  Temp(Src) 99.4 F (37.4 C) (Oral)  Ht  (1.626 m)  Wt 163 lb 6 oz (74.106 kg)  BMI 28.03 kg/m2  SpO2 97% VS noted,  Constitutional: Pt appears well-developed, well-nourished./morbid obese  HENT: Head: NCAT.  Right Ear: External ear normal.  Left Ear: External ear normal.  Eyes: . Pupils are equal, round, and reactive to light. Conjunctivae and EOM are normal Neck: Normal  range of motion. Neck supple.  Cardiovascular: Normal rate and regular rhythm.   Pulmonary/Chest: Effort normal and breath sounds normal.  Abd:  Soft, NT, ND, +BS - no HSM, no guarding Neurological: Pt is alert. Not confused , motor grossly intact Skin: Skin is warm. No rash Psychiatric: Pt behavior is normal. No agitation.     Assessment & Plan:   BP Readings from Last 3 Encounters:  10/20/13 160/90  09/10/13 144/78  08/31/13 191/87

## 2013-10-24 NOTE — Assessment & Plan Note (Signed)
Lab Results  Component Value Date   TSH 3.40 09/08/2013   stable overall by history and exam, recent data reviewed with pt, and pt to continue medical treatment as before,  to f/u any worsening symptoms or concerns

## 2013-10-24 NOTE — Assessment & Plan Note (Signed)
Uncontrolled, to Add amlod 5 qd for overall better control, for f/u BP at home and next visit

## 2013-10-24 NOTE — Assessment & Plan Note (Addendum)
To start prilosec 20 qd,  to f/u any worsening symptoms or concerns, also for h pylori ab per pt request

## 2013-10-24 NOTE — Assessment & Plan Note (Signed)
stable overall by history and exam, recent data reviewed with pt, and pt to continue medical treatment as before,  to f/u any worsening symptoms or concerns Lab Results  Component Value Date   LDLCALC 101* 09/08/2013

## 2013-10-25 LAB — HM DIABETES EYE EXAM

## 2013-10-26 ENCOUNTER — Encounter: Payer: Self-pay | Admitting: Internal Medicine

## 2013-12-08 ENCOUNTER — Other Ambulatory Visit: Payer: PRIVATE HEALTH INSURANCE

## 2013-12-13 ENCOUNTER — Ambulatory Visit: Payer: PRIVATE HEALTH INSURANCE | Admitting: Endocrinology

## 2013-12-15 ENCOUNTER — Other Ambulatory Visit (INDEPENDENT_AMBULATORY_CARE_PROVIDER_SITE_OTHER): Payer: Commercial Managed Care - PPO

## 2013-12-15 DIAGNOSIS — IMO0002 Reserved for concepts with insufficient information to code with codable children: Secondary | ICD-10-CM

## 2013-12-15 DIAGNOSIS — E1065 Type 1 diabetes mellitus with hyperglycemia: Secondary | ICD-10-CM

## 2013-12-15 LAB — GLUCOSE, RANDOM: Glucose, Bld: 127 mg/dL — ABNORMAL HIGH (ref 70–99)

## 2013-12-15 LAB — HEMOGLOBIN A1C: Hgb A1c MFr Bld: 7.3 % — ABNORMAL HIGH (ref 4.6–6.5)

## 2013-12-17 ENCOUNTER — Other Ambulatory Visit: Payer: Self-pay | Admitting: *Deleted

## 2013-12-17 ENCOUNTER — Ambulatory Visit (INDEPENDENT_AMBULATORY_CARE_PROVIDER_SITE_OTHER): Payer: Commercial Managed Care - PPO | Admitting: Endocrinology

## 2013-12-17 ENCOUNTER — Encounter: Payer: Self-pay | Admitting: Endocrinology

## 2013-12-17 VITALS — BP 162/80 | HR 83 | Temp 97.9°F | Resp 14 | Ht 64.0 in | Wt 163.6 lb

## 2013-12-17 DIAGNOSIS — IMO0002 Reserved for concepts with insufficient information to code with codable children: Secondary | ICD-10-CM | POA: Insufficient documentation

## 2013-12-17 DIAGNOSIS — E063 Autoimmune thyroiditis: Secondary | ICD-10-CM | POA: Insufficient documentation

## 2013-12-17 DIAGNOSIS — E785 Hyperlipidemia, unspecified: Secondary | ICD-10-CM

## 2013-12-17 DIAGNOSIS — E038 Other specified hypothyroidism: Secondary | ICD-10-CM

## 2013-12-17 DIAGNOSIS — E1065 Type 1 diabetes mellitus with hyperglycemia: Secondary | ICD-10-CM | POA: Insufficient documentation

## 2013-12-17 DIAGNOSIS — I10 Essential (primary) hypertension: Secondary | ICD-10-CM | POA: Insufficient documentation

## 2013-12-17 MED ORDER — VALSARTAN 80 MG PO TABS: 80.0000 mg | ORAL_TABLET | Freq: Every day | ORAL | Status: DC

## 2013-12-17 NOTE — Patient Instructions (Signed)
Valsartan in am for BP  Exercise daily

## 2013-12-17 NOTE — Progress Notes (Signed)
Patient ID: Stacy Cain, female   DOB: 1945-05-04, 68 y.o.   MRN: [id number]   Reason for Appointment: Insulin Pump followup:   History of Present Illness   Diagnosis: Type 1 DIABETES MELITUS, date of diagnosis:  1974  DIABETES history: She has been on an insulin pump for several years with variable control. She is fairly compliant with monitoring her blood sugars and doing some adjustments of her pump settings but continues to have variability in blood sugar  Her A1c has previously been stable between 7-7.4%  CURRENT insulin pump:  [employer]   RECENT HISTORY:  Her A1c appears to be Improved on this visit at 7.3, previously 7.6 She is  adjusting her basal rates when she sees consistently abnormal readings  She thinks that her blood sugars are much lower when she was taking Prilosec in October but is not taking this now Her basal rates are the same as on her last visit Current blood sugar patterns and problems:  Fasting readings are somewhat variable but overall fairly good and recently not low  Blood sugars are generally higher in the early afternoon but she is usually not checking her readings around lunchtime  Blood sugars are usually near-normal between 4 PM-midnight with some variability including mildly low readings about 10 days ago  Blood sugars after meals appear to be fairly good but difficult to analyze especially in the morning since she is having carbohydrate intake multiple times  She had an episode of marked hyperglycemia on 11/5 and she forgot to reconnect her pump  Lowest reading is 54 at about 6 PM  GLUCOSE CONTROL with the pump is assessed today by pump download.  PREMEAL Breakfast Lunch Dinner Bedtime Overall  Glucose range: 55- 400+ 155-249 78-213 98-194   Mean/median:     146+/-74   She is not doing any exercise even though she has a treadmill at home    PUMP SETTINGS are: Basal rate: Midnight = 0.7; 7 AM = 1.6,  1 PM = 1.0 and  9 PM = 0.65  Carb Ratio:  1: 12 Correction factor 1: 50 at 6 AM and 45 at 12 noon; 55 overnight, target 110, active insulin 4 hours  Wt Readings from Last 3 Encounters:  12/17/13 163 lb 9.6 oz (74.208 kg)  10/20/13 163 lb 6 oz (74.106 kg)  09/10/13 166 lb (75.297 kg)     Lab Results  Component Value Date   HGBA1C 7.3* 12/15/2013   HGBA1C 7.6* 09/08/2013   HGBA1C 7.5* 06/04/2013   Lab Results  Component Value Date   MICROALBUR 1.7 06/04/2013   LDLCALC 101* 09/08/2013   CREATININE 1.1 09/08/2013     Appointment on 12/15/2013  Component Date Value Ref Range Status  . Hgb A1c MFr Bld 12/15/2013 7.3* 4.6 - 6.5 % Final   Glycemic Control Guidelines for People with Diabetes:Non Diabetic:  <6%Goal of Therapy: <7%Additional Action Suggested:  >8%   . Glucose, Bld 12/15/2013 127* 70 - 99 mg/dL Final      Medication List       This list is accurate as of: 12/17/13 11:28 AM.  Always use your most recent med list.               amLODipine 5 MG tablet  Commonly known as:  NORVASC  Take 1 tablet (5 mg total) by mouth daily.     calcipotriene-betamethasone ointment  Commonly known as:  TACLONEX  Apply topically 2 (two) times daily.     insulin  aspart 100 UNIT/ML injection  Commonly known as:  NOVOLOG  Use with insulin pump 35-40 units per day     levothyroxine 88 MCG tablet  Commonly known as:  SYNTHROID, LEVOTHROID  Take 1 tablet (88 mcg total) by mouth daily before breakfast.     meloxicam 15 MG tablet  Commonly known as:  MOBIC  Take 1 tablet (15 mg total) by mouth daily.     simvastatin 40 MG tablet  Commonly known as:  ZOCOR  Take 1 tablet (40 mg total) by mouth at bedtime.     urea 40 % Crea  Commonly known as:  CARMOL  Apply topically 2 (two) times daily.        Allergies:  Allergies  Allergen Reactions  . Atorvastatin     REACTION: joint pains  . Influenza Vaccines     Patient got bursitis after her vaccine  . Losartan Potassium     REACTION: blurred vision  . Metoprolol  Succinate     REACTION: trigger fingers, arm pain  . Olmesartan Medoxomil   . Quinapril Hcl     REACTION: throat swelling    Past Medical History  Diagnosis Date  . ANXIETY 10/28/2007  . BACK PAIN 12/23/2007  . CAROTID BRUIT 07/28/2009  . CORONARY ARTERY DISEASE 07/22/2007  . DIABETES MELLITUS, TYPE I 07/22/2007  . GERD 12/23/2007  . HAIR LOSS 02/17/2009  . HYPERLIPIDEMIA 07/22/2007  . HYPERTENSION 07/22/2007  . HYPOTHYROIDISM 07/22/2007  . SKIN LESION 10/27/2007  . URI 12/23/2007    Past Surgical History  Procedure Laterality Date  . S/p coronary stent    . Coronary artery bypass graft    . Cesarean section      x2  . Tubal ligation      Family History  Problem Relation Age of Onset  . Hypertension Mother   . Melanoma Mother   . Alcohol abuse Sister   . Arthritis Maternal Grandmother     Social History:  reports that she has never smoked. She does not have any smokeless tobacco history on file. She reports that she does not drink alcohol or use illicit drugs.  REVIEW of systems:  Eye exam last 9/15  HYPOTHYROIDISM: She has had long-standing primary hypothyroidism and previously has been on either Armour Thyroid or levothyroxine. She thinks she did not tolerate Armour Thyroid.  She has been taking 88 mcg regularly  No unusual fatigue and her last TSH is:  Lab Results  Component Value Date   TSH 3.40 09/08/2013    ?  Hypertension She has had borderline or high blood pressure readings  but not taking medications; she is reluctant to take any because of side effects with some of the drugs she has taken. No microalbuminuria Blood pressure is high again and was given higher with her PCP who recommended amlodipine but she has not taken this Home BP 140 or so, not clear how often she checks blood pressure  Hyperlipidemia: on her last visit she agreed to restart taking Zocor  Lab Results  Component Value Date   CHOL 193 09/08/2013   HDL 82.10 09/08/2013   LDLCALC 101*  09/08/2013   LDLDIRECT 137.3 02/24/2013   TRIG 51.0 09/08/2013   CHOLHDL 2 09/08/2013       EXAM:  BP 162/80 mmHg  Pulse 83  Temp(Src) 97.9 F (36.6 C)  Resp 14  Ht 5\' 4"  (1.626 m)  Wt 163 lb 9.6 oz (74.208 kg)  BMI 28.07 kg/m2  SpO2 96%  Repeat blood pressure  144/78  ASSESSMENT:  DIABETES type 1:   Blood sugars are overall about the same with A1c 7.3; may be slightly lower since she reports lower glucose readings last month and also on her last visit she had steroid-induced hyperglycemia See history of present illness for detailed discussion of current blood sugar patterns, problems identified and current management Although she is checking her sugar nearly 4 times a day on average she is not doing this consistently especially the last few days Difficult to identify trend with her postprandial readings but these are usually adequate Only significant trend is relatively higher readings in the early afternoon Has sporadic hypoglycemia which is unexplained  Recommendations made today:  Continue same basal rates but continue the 1.6 basal rate until 3 PM instead of 1 PM  More frequent glucose monitoring midday and early afternoon  Consider using extended wave bolus for high fat meals which she is not doing currently  May use a lower temporary basal when starting exercise  Follow-up in 3 months with repeat A1c    Hypertension: start using Diovan since blood pressure consistently high. She agrees to do this since there is evidence that the ACE inhibitor is an ARB drugs may help retinopathy  HYPOTHYROIDISM: TSH needs to be rechecked on the next visit  Hyperlipidemia: Will need follow-up lipids on the next visit, will make sure she is compliant with her Zocor  Counseling time over 50% of today's 25 minute visit  Kajol Crispen 12/17/2013, 11:28 AM

## 2014-01-07 ENCOUNTER — Encounter: Payer: PRIVATE HEALTH INSURANCE | Admitting: Internal Medicine

## 2014-01-12 ENCOUNTER — Encounter: Payer: PRIVATE HEALTH INSURANCE | Admitting: Internal Medicine

## 2014-01-14 ENCOUNTER — Telehealth: Payer: Self-pay | Admitting: Endocrinology

## 2014-01-14 NOTE — Telephone Encounter (Signed)
Patient would like to know have you received the fax from her insurance co, for test strips, Please advise

## 2014-01-17 ENCOUNTER — Other Ambulatory Visit: Payer: Self-pay | Admitting: Endocrinology

## 2014-01-18 ENCOUNTER — Other Ambulatory Visit: Payer: Self-pay | Admitting: *Deleted

## 2014-01-18 MED ORDER — LEVOTHYROXINE SODIUM 88 MCG PO TABS: 88.0000 ug | ORAL_TABLET | Freq: Every day | ORAL | Status: DC

## 2014-01-19 NOTE — Telephone Encounter (Signed)
Stacy Bibleat would like to know if you received a fax from her insurance Co. Please advise

## 2014-01-20 NOTE — Telephone Encounter (Signed)
Patient need strip for [employer] countor Next enough for 30 days. Please advise

## 2014-01-21 ENCOUNTER — Other Ambulatory Visit: Payer: Self-pay | Admitting: *Deleted

## 2014-01-21 MED ORDER — GLUCOSE BLOOD VI STRP: ORAL_STRIP | Status: DC

## 2014-01-26 ENCOUNTER — Other Ambulatory Visit: Payer: Self-pay | Admitting: *Deleted

## 2014-01-26 MED ORDER — INSULIN ASPART 100 UNIT/ML ~~LOC~~ SOLN: 60.0000 [IU] | Freq: Once | SUBCUTANEOUS | Status: DC

## 2014-02-09 ENCOUNTER — Other Ambulatory Visit: Payer: Self-pay | Admitting: *Deleted

## 2014-02-09 ENCOUNTER — Telehealth: Payer: Self-pay | Admitting: Endocrinology

## 2014-02-09 MED ORDER — INSULIN LISPRO 100 UNIT/ML ~~LOC~~ SOLN: SUBCUTANEOUS | Status: DC

## 2014-02-09 NOTE — Telephone Encounter (Signed)
Pt calling regarding insulin can she switch to humalog instead insurance is no longer covering novolog.

## 2014-02-09 NOTE — Telephone Encounter (Signed)
rx sent for Humalog, Patient aware

## 2014-03-18 ENCOUNTER — Other Ambulatory Visit: Payer: Commercial Managed Care - PPO

## 2014-03-23 ENCOUNTER — Telehealth: Payer: Self-pay | Admitting: Endocrinology

## 2014-03-23 ENCOUNTER — Ambulatory Visit: Payer: Commercial Managed Care - PPO | Admitting: Endocrinology

## 2014-03-23 MED ORDER — ONETOUCH ULTRA 2 W/DEVICE KIT: PACK | Status: DC

## 2014-03-23 MED ORDER — GLUCOSE BLOOD VI STRP: ORAL_STRIP | Status: DC

## 2014-03-23 NOTE — Telephone Encounter (Signed)
Rx sent 

## 2014-03-23 NOTE — Telephone Encounter (Signed)
Pt needs one touch ultra test strips and meter called into [facility] F# [phone]. This is a new rx  90 day supply she tests 7 times daily

## 2014-04-13 ENCOUNTER — Other Ambulatory Visit: Payer: Commercial Managed Care - PPO

## 2014-04-15 ENCOUNTER — Ambulatory Visit: Payer: Commercial Managed Care - PPO | Admitting: Endocrinology

## 2014-04-27 ENCOUNTER — Other Ambulatory Visit (INDEPENDENT_AMBULATORY_CARE_PROVIDER_SITE_OTHER): Payer: Commercial Managed Care - PPO

## 2014-04-27 DIAGNOSIS — IMO0002 Reserved for concepts with insufficient information to code with codable children: Secondary | ICD-10-CM

## 2014-04-27 DIAGNOSIS — E063 Autoimmune thyroiditis: Secondary | ICD-10-CM

## 2014-04-27 DIAGNOSIS — E1065 Type 1 diabetes mellitus with hyperglycemia: Secondary | ICD-10-CM | POA: Diagnosis not present

## 2014-04-27 DIAGNOSIS — E038 Other specified hypothyroidism: Secondary | ICD-10-CM

## 2014-04-27 DIAGNOSIS — E785 Hyperlipidemia, unspecified: Secondary | ICD-10-CM

## 2014-04-27 LAB — COMPREHENSIVE METABOLIC PANEL
ALK PHOS: 112 U/L (ref 39–117)
ALT: 14 U/L (ref 0–35)
AST: 20 U/L (ref 0–37)
Albumin: 3.9 g/dL (ref 3.5–5.2)
BUN: 13 mg/dL (ref 6–23)
CO2: 29 mEq/L (ref 19–32)
CREATININE: 1.02 mg/dL (ref 0.40–1.20)
Calcium: 9.3 mg/dL (ref 8.4–10.5)
Chloride: 103 mEq/L (ref 96–112)
GFR: 57.2 mL/min — ABNORMAL LOW (ref >60.00)
Glucose, Bld: 101 mg/dL — ABNORMAL HIGH (ref 70–99)
Potassium: 4.4 mEq/L (ref 3.5–5.1)
Sodium: 138 mEq/L (ref 135–145)
TOTAL PROTEIN: 7 g/dL (ref 6.0–8.3)
Total Bilirubin: 0.4 mg/dL (ref 0.2–1.2)

## 2014-04-27 LAB — LIPID PANEL
Cholesterol: 232 mg/dL — ABNORMAL HIGH (ref 0–200)
HDL: 86.1 mg/dL (ref >39.00)
LDL Cholesterol: 133 mg/dL — ABNORMAL HIGH (ref 0–99)
NonHDL: 145.9
Total CHOL/HDL Ratio: 3
Triglycerides: 63 mg/dL (ref 0.0–149.0)
VLDL: 12.6 mg/dL (ref 0.0–40.0)

## 2014-04-27 LAB — T4, FREE: FREE T4: 0.71 ng/dL (ref 0.60–1.60)

## 2014-04-27 LAB — HEMOGLOBIN A1C: HEMOGLOBIN A1C: 7.2 % — AB (ref 4.6–6.5)

## 2014-04-27 LAB — TSH: TSH: 38.76 u[IU]/mL — AB (ref 0.35–4.50)

## 2014-05-06 ENCOUNTER — Other Ambulatory Visit: Payer: Self-pay | Admitting: *Deleted

## 2014-05-06 ENCOUNTER — Encounter: Payer: Self-pay | Admitting: Endocrinology

## 2014-05-06 ENCOUNTER — Ambulatory Visit (INDEPENDENT_AMBULATORY_CARE_PROVIDER_SITE_OTHER): Payer: Commercial Managed Care - PPO | Admitting: Endocrinology

## 2014-05-06 VITALS — BP 140/84 | HR 83 | Temp 97.8°F | Resp 14 | Ht 61.0 in | Wt 165.4 lb

## 2014-05-06 DIAGNOSIS — E785 Hyperlipidemia, unspecified: Secondary | ICD-10-CM

## 2014-05-06 DIAGNOSIS — E038 Other specified hypothyroidism: Secondary | ICD-10-CM

## 2014-05-06 DIAGNOSIS — I10 Essential (primary) hypertension: Secondary | ICD-10-CM

## 2014-05-06 DIAGNOSIS — IMO0002 Reserved for concepts with insufficient information to code with codable children: Secondary | ICD-10-CM

## 2014-05-06 DIAGNOSIS — E063 Autoimmune thyroiditis: Secondary | ICD-10-CM

## 2014-05-06 DIAGNOSIS — E1065 Type 1 diabetes mellitus with hyperglycemia: Secondary | ICD-10-CM | POA: Diagnosis not present

## 2014-05-06 NOTE — Progress Notes (Signed)
Patient ID: Stacy Cain, female   DOB: 1945/09/30, 69 y.o.   MRN: [id number]   Reason for Appointment: Insulin Pump followup:   History of Present Illness   Diagnosis: Type 1 DIABETES MELITUS, date of diagnosis:  1974  DIABETES history: She has been on an insulin pump for several years with variable control. She is fairly compliant with monitoring her blood sugars and doing some adjustments of her pump settings but continues to have variability in blood sugar  Her A1c has previously been stable between 7-7.4%  CURRENT insulin pump:  Medtronic   RECENT HISTORY:   PUMP SETTINGS are: Basal rate: Midnight = 0.6; 7 AM = 1.5,  1 PM = 1.0 and  9 PM = 0.65  Carb Ratio: 1: 12 Correction factor 1: 50 at 6 AM and 45 at 12 noon; 55 overnight, target 110, active insulin 4 hours  Her A1c still fairly good at 7.2 although review of her home blood sugars show significant variability at times  She usually is adjusting her basal rates when she sees consistently abnormal readings but has not changed any settings for the last couple of months    She has taken a lower basal rate between midnight and 3 PM since her last visit Current blood sugar patterns and problems:   She has not been able to link her meter with her pump and appears to be using 2 different monitors. Also checking very  Infrequently.   fastingreadings are somewhat variable with a couple of low blood sugars but only rarely above 130 per  She has not done many readings after meals but has at least one low blood sugar after breakfast and some tendency to low readings right before supper time about a week ago   Rarely will have a high reading in the evening from forgetting to bolus on time for a meal  GLUCOSE CONTROL with the pump is assessed today by pump download.  PRE-MEAL Breakfast Lunch Dinner Bedtime Overall  Glucose range:   43- 189  73, 163  54- 156  55- 257   Mean/median:      115    EXERCISE:She is not doing any exercise even  though she has a treadmill at home   Wt Readings from Last 3 Encounters:  05/06/14 165 lb 6.4 oz (75.025 kg)  12/17/13 163 lb 9.6 oz (74.208 kg)  10/20/13 163 lb 6 oz (74.106 kg)     Lab Results  Component Value Date   HGBA1C 7.2* 04/27/2014   HGBA1C 7.3* 12/15/2013   HGBA1C 7.6* 09/08/2013   Lab Results  Component Value Date   MICROALBUR 1.7 06/04/2013   LDLCALC 133* 04/27/2014   CREATININE 1.02 04/27/2014     No visits with results within 1 Week(s) from this visit. Latest known visit with results is:  Lab on 04/27/2014  Component Date Value Ref Range Status  . Hgb A1c MFr Bld 04/27/2014 7.2* 4.6 - 6.5 % Final   Glycemic Control Guidelines for People with Diabetes:Non Diabetic:  <6%Goal of Therapy: <7%Additional Action Suggested:  >8%   . Sodium 04/27/2014 138  135 - 145 mEq/L Final  . Potassium 04/27/2014 4.4  3.5 - 5.1 mEq/L Final  . Chloride 04/27/2014 103  96 - 112 mEq/L Final  . CO2 04/27/2014 29  19 - 32 mEq/L Final  . Glucose, Bld 04/27/2014 101* 70 - 99 mg/dL Final  . BUN 04/27/2014 13  6 - 23 mg/dL Final  . Creatinine, Ser 04/27/2014 1.02  0.40 - 1.20 mg/dL Final  . Total Bilirubin 04/27/2014 0.4  0.2 - 1.2 mg/dL Final  . Alkaline Phosphatase 04/27/2014 112  39 - 117 U/L Final  . AST 04/27/2014 20  0 - 37 U/L Final  . ALT 04/27/2014 14  0 - 35 U/L Final  . Total Protein 04/27/2014 7.0  6.0 - 8.3 g/dL Final  . Albumin 04/27/2014 3.9  3.5 - 5.2 g/dL Final  . Calcium 04/27/2014 9.3  8.4 - 10.5 mg/dL Final  . GFR 04/27/2014 57.20* >60.00 mL/min Final  . Cholesterol 04/27/2014 232* 0 - 200 mg/dL Final   ATP III Classification       Desirable:  < 200 mg/dL               Borderline High:  200 - 239 mg/dL          High:  > = 240 mg/dL  . Triglycerides 04/27/2014 63.0  0.0 - 149.0 mg/dL Final   Normal:  <150 mg/dLBorderline High:  150 - 199 mg/dL  . HDL 04/27/2014 86.10  >39.00 mg/dL Final  . VLDL 04/27/2014 12.6  0.0 - 40.0 mg/dL Final  . LDL Cholesterol  04/27/2014 133* 0 - 99 mg/dL Final  . Total CHOL/HDL Ratio 04/27/2014 3   Final                  Men          Women1/2 Average Risk     3.4          3.3Average Risk          5.0          4.42X Average Risk          9.6          7.13X Average Risk          15.0          11.0                      . NonHDL 04/27/2014 145.90   Final   NOTE:  Non-HDL goal should be 30 mg/dL higher than patient's LDL goal (i.e. LDL goal of < 70 mg/dL, would have non-HDL goal of < 100 mg/dL)  . TSH 04/27/2014 38.76* 0.35 - 4.50 uIU/mL Final  . Free T4 04/27/2014 0.71  0.60 - 1.60 ng/dL Final      Medication List       This list is accurate as of: 05/06/14 11:59 PM.  Always use your most recent med list.               calcipotriene-betamethasone ointment  Commonly known as:  TACLONEX  Apply topically 2 (two) times daily.     glucose blood test strip  Commonly known as:  ONE TOUCH ULTRA TEST  Use to check blood sugar 7 times per day     insulin lispro 100 UNIT/ML injection  Commonly known as:  HUMALOG  Use 60 units per day     levothyroxine 88 MCG tablet  Commonly known as:  SYNTHROID, LEVOTHROID  Take 1 tablet (88 mcg total) by mouth daily before breakfast.     meloxicam 15 MG tablet  Commonly known as:  MOBIC  Take 1 tablet (15 mg total) by mouth daily.     ONE TOUCH ULTRA 2 W/DEVICE Kit  Use to check blood sugar 7 times per day     simvastatin 40 MG tablet  Commonly known as:  ZOCOR  Take 1 tablet (40 mg total) by mouth at bedtime.     urea 40 % Crea  Commonly known as:  CARMOL  Apply topically 2 (two) times daily.     valsartan 80 MG tablet  Commonly known as:  DIOVAN  Take 1 tablet (80 mg total) by mouth daily.        Allergies:  Allergies  Allergen Reactions  . Atorvastatin     REACTION: joint pains  . Influenza Vaccines     Patient got bursitis after her vaccine  . Losartan Potassium     REACTION: blurred vision  . Metoprolol Succinate     REACTION: trigger fingers, arm  pain  . Olmesartan Medoxomil   . Quinapril Hcl     REACTION: throat swelling    Past Medical History  Diagnosis Date  . ANXIETY 10/28/2007  . BACK PAIN 12/23/2007  . CAROTID BRUIT 07/28/2009  . CORONARY ARTERY DISEASE 07/22/2007  . DIABETES MELLITUS, TYPE I 07/22/2007  . GERD 12/23/2007  . HAIR LOSS 02/17/2009  . HYPERLIPIDEMIA 07/22/2007  . HYPERTENSION 07/22/2007  . HYPOTHYROIDISM 07/22/2007  . SKIN LESION 10/27/2007  . URI 12/23/2007    Past Surgical History  Procedure Laterality Date  . S/p coronary stent    . Coronary artery bypass graft    . Cesarean section      x2  . Tubal ligation      Family History  Problem Relation Age of Onset  . Hypertension Mother   . Melanoma Mother   . Alcohol abuse Sister   . Arthritis Maternal Grandmother     Social History:  reports that she has never smoked. She does not have any smokeless tobacco history on file. She reports that she does not drink alcohol or use illicit drugs.  REVIEW of systems:  Eye exam last 9/15 , has retinopathy  HYPOTHYROIDISM: She has had long-standing primary hypothyroidism and previously has been on either Armour Thyroid or levothyroxine. She thinks she did not tolerate Armour Thyroid.  She has been taking 88 mcg regularly    Since her last visit she started taking her thyroid medication in the morning instead of at at bedtime even though she had been taking this consistently in the  Bedtime previously with adequate control of TSH.  She will forget to take the medication in the morning frequently She does feel more tired and her TSH is markedly increased now   Lab Results  Component Value Date   TSH 38.76* 04/27/2014   TSH 3.40 09/08/2013   TSH 0.98 02/24/2013   FREET4 0.71 04/27/2014   FREET4 0.92 09/08/2013   FREET4 1.12 02/24/2013     ?  Hypertension She has had borderline or high blood pressure readings but still not taking  any Prescribed medication; she is reluctant to take any because of  side effects with some of the drugs she has taken.   No microalbuminuria Blood pressure is  relativelyhigh again Home BP 140/70, not clear how often she checks blood pressure  Hyperlipidemia:  On several of her previous visits she has agreed to restart taking Zocor but still has not done this even though she goes to the pharmacy and get the medication.  She is still not convinced that she needs to take this   Her lipids are worse.  She now said that she is using coconut oil in cooking  Lab Results  Component Value Date   CHOL 232* 04/27/2014   HDL 86.10 04/27/2014  LDLCALC 133* 04/27/2014   LDLDIRECT 137.3 02/24/2013   TRIG 63.0 04/27/2014   CHOLHDL 3 04/27/2014       EXAM:  BP 140/84 mmHg  Pulse 83  Temp(Src) 97.8 F (36.6 C)  Resp 14  Ht 5' 1"  (1.549 m)  Wt 165 lb 6.4 oz (75.025 kg)  BMI 31.27 kg/m2  SpO2 96%   No pedal edema   ASSESSMENT:  DIABETES type 1:   Blood sugars are overall  Fairly well controlled with some variability in her blood sugars A1c has been just over 7% fairly consistently See history of present illness for detailed discussion of current blood sugar patterns, problems identified and current management    She does not have much hyperglycemia on her meter in pump download but has not been checking blood sugars as much as necessary and bolusing usually without checking her blood sugar.  Probably using a couple of different monitors which are not leaking to her pump and may not have all her readings today  She tends to have lower readings in the late afternoon  Difficult to identify trend with her postprandial readings but these are usually adequate Has sporadic hypoglycemia  Including some after meals  she has been reluctant to use continuous glucose monitoring although this should benefit her  Recommendations made today:  More frequent glucose monitoring midday and early afternoon   Reduce basal rate between 3 PM- 7 PM  With the new setting  0.9  Consider using extended wave bolus for high fat meals   May use a lower temporary basal when starting exercise   more postprandial readings to help adjust abided ratio  Follow-up in 3 months with repeat A1c    Hypertension:  She is reluctant to take medications but discussed the benefits of ARB drugs and preventing diabetic nephropathy and helping retinopathy which she has   Reassured her that she is going to be starting a small dose only  HYPOTHYROIDISM:  She needs to start taking  Medication back again at bedtime which she was doing before with adequate control and better compliance. She will try to eat smaller snacks at that time  Hyperlipidemia:  She agrees to start simvastatin for its multiple benefits Will need follow-up lipids on the next visit, will make sure she is compliant with her Zocor  Counseling time over 50% of today's 25 minute visit  Patient Instructions   Start taking valsartan half tablet a day for the first week and then full tablet daily as long as the blood pressure is still over 120.  This will help your retinopathy and prevent kidney damage from the diabetes  SIMVASTATIN: This will help reduce heart disease and prevent further hardening of the arteries.  Essential to take this regularly for life as indicated by the American heart association  Take thyroid medication at bedtime preferably with only small amount of food if any  Check blood sugars more often after meals to see if carbohydrate coverage is adequate or excessive  Avoid coconut oil     Zaylia Riolo 05/09/2014, 9:41 AM

## 2014-05-06 NOTE — Patient Instructions (Signed)
  Start taking valsartan half tablet a day for the first week and then full tablet daily as long as the blood pressure is still over 120.  This will help your retinopathy and prevent kidney damage from the diabetes  SIMVASTATIN: This will help reduce heart disease and prevent further hardening of the arteries.  Essential to take this regularly for life as indicated by the American heart association  Take thyroid medication at bedtime preferably with only small amount of food if any  Check blood sugars more often after meals to see if carbohydrate coverage is adequate or excessive  Avoid coconut oil

## 2014-05-13 ENCOUNTER — Telehealth: Payer: Self-pay | Admitting: *Deleted

## 2014-05-13 NOTE — Telephone Encounter (Signed)
Amy called regarding pts glucose monitor not being covered under pts current plan.

## 2014-05-16 NOTE — Telephone Encounter (Signed)
Please start prior authorization.  She needs the Contour test strips I believe as no other test strips will be compatible with her pump

## 2014-05-16 NOTE — Telephone Encounter (Signed)
Received notice from pt's insurance about test Strips waiting on appeal to be started.

## 2014-05-20 NOTE — Telephone Encounter (Signed)
Pl complete

## 2014-05-20 NOTE — Telephone Encounter (Signed)
Appeal was started on April 5th., I am waiting to hear back from them

## 2014-05-30 ENCOUNTER — Telehealth: Payer: Self-pay | Admitting: *Deleted

## 2014-05-30 NOTE — Telephone Encounter (Signed)
ok 

## 2014-05-30 NOTE — Telephone Encounter (Signed)
Patient wants to know if she can come back in now and have her TSH rechecked, she said she's feeling very tired, and just wants to sit on the couch.  She stated you did not make any medication adjustment when she was here 4 weeks ago.  Please advise.

## 2014-05-31 ENCOUNTER — Other Ambulatory Visit: Payer: Self-pay | Admitting: *Deleted

## 2014-05-31 NOTE — Telephone Encounter (Signed)
Message left on patients vm lettering her know it was okay.

## 2014-06-01 ENCOUNTER — Other Ambulatory Visit (INDEPENDENT_AMBULATORY_CARE_PROVIDER_SITE_OTHER): Payer: Commercial Managed Care - PPO

## 2014-06-01 DIAGNOSIS — E1065 Type 1 diabetes mellitus with hyperglycemia: Secondary | ICD-10-CM | POA: Diagnosis not present

## 2014-06-01 DIAGNOSIS — IMO0002 Reserved for concepts with insufficient information to code with codable children: Secondary | ICD-10-CM

## 2014-06-01 DIAGNOSIS — E785 Hyperlipidemia, unspecified: Secondary | ICD-10-CM

## 2014-06-01 DIAGNOSIS — E038 Other specified hypothyroidism: Secondary | ICD-10-CM | POA: Diagnosis not present

## 2014-06-01 DIAGNOSIS — E063 Autoimmune thyroiditis: Secondary | ICD-10-CM

## 2014-06-01 LAB — LIPID PANEL
Cholesterol: 205 mg/dL — ABNORMAL HIGH (ref 0–200)
HDL: 76.2 mg/dL (ref >39.00)
LDL CALC: 114 mg/dL — AB (ref 0–99)
NonHDL: 128.8
TRIGLYCERIDES: 73 mg/dL (ref 0.0–149.0)
Total CHOL/HDL Ratio: 3
VLDL: 14.6 mg/dL (ref 0.0–40.0)

## 2014-06-01 LAB — COMPREHENSIVE METABOLIC PANEL
ALBUMIN: 4.2 g/dL (ref 3.5–5.2)
ALK PHOS: 106 U/L (ref 39–117)
ALT: 19 U/L (ref 0–35)
AST: 21 U/L (ref 0–37)
BUN: 14 mg/dL (ref 6–23)
CALCIUM: 9.9 mg/dL (ref 8.4–10.5)
CO2: 28 mEq/L (ref 19–32)
Chloride: 101 mEq/L (ref 96–112)
Creatinine, Ser: 1.47 mg/dL — ABNORMAL HIGH (ref 0.40–1.20)
GFR: 37.51 mL/min — ABNORMAL LOW (ref >60.00)
Glucose, Bld: 189 mg/dL — ABNORMAL HIGH (ref 70–99)
Potassium: 4.6 mEq/L (ref 3.5–5.1)
SODIUM: 134 meq/L — AB (ref 135–145)
Total Bilirubin: 0.5 mg/dL (ref 0.2–1.2)
Total Protein: 7.3 g/dL (ref 6.0–8.3)

## 2014-06-01 LAB — MICROALBUMIN / CREATININE URINE RATIO
Creatinine,U: 264.2 mg/dL
MICROALB UR: 4.9 mg/dL — AB (ref 0.0–1.9)
Microalb Creat Ratio: 1.9 mg/g (ref 0.0–30.0)

## 2014-06-01 LAB — TSH: TSH: 2.11 u[IU]/mL (ref 0.35–4.50)

## 2014-06-01 LAB — HEMOGLOBIN A1C: HEMOGLOBIN A1C: 7.1 % — AB (ref 4.6–6.5)

## 2014-06-03 ENCOUNTER — Telehealth: Payer: Self-pay | Admitting: Endocrinology

## 2014-06-03 NOTE — Telephone Encounter (Signed)
Pt returning your call

## 2014-06-08 ENCOUNTER — Ambulatory Visit (INDEPENDENT_AMBULATORY_CARE_PROVIDER_SITE_OTHER): Payer: Commercial Managed Care - PPO | Admitting: Internal Medicine

## 2014-06-08 ENCOUNTER — Other Ambulatory Visit (INDEPENDENT_AMBULATORY_CARE_PROVIDER_SITE_OTHER): Payer: Commercial Managed Care - PPO

## 2014-06-08 ENCOUNTER — Encounter: Payer: Self-pay | Admitting: Internal Medicine

## 2014-06-08 VITALS — BP 122/82 | HR 78 | Temp 98.6°F | Resp 18 | Ht 61.0 in | Wt 161.1 lb

## 2014-06-08 DIAGNOSIS — N179 Acute kidney failure, unspecified: Secondary | ICD-10-CM | POA: Diagnosis not present

## 2014-06-08 DIAGNOSIS — Z0000 Encounter for general adult medical examination without abnormal findings: Secondary | ICD-10-CM

## 2014-06-08 LAB — BASIC METABOLIC PANEL
BUN: 10 mg/dL (ref 6–23)
CO2: 27 meq/L (ref 19–32)
CREATININE: 0.81 mg/dL (ref 0.40–1.20)
Calcium: 9.6 mg/dL (ref 8.4–10.5)
Chloride: 101 mEq/L (ref 96–112)
GFR: 74.61 mL/min (ref >60.00)
Glucose, Bld: 133 mg/dL — ABNORMAL HIGH (ref 70–99)
Potassium: 4.8 mEq/L (ref 3.5–5.1)
Sodium: 135 mEq/L (ref 135–145)

## 2014-06-08 MED ORDER — ASPIRIN EC 81 MG PO TBEC: 81.0000 mg | DELAYED_RELEASE_TABLET | Freq: Every day | ORAL | Status: DC

## 2014-06-08 NOTE — Assessment & Plan Note (Signed)
Etiology unclear, lab error may be possible as well, simply for f/u BMP today, consider u/s, consider renal referral

## 2014-06-08 NOTE — Progress Notes (Signed)
Subjective:    Patient ID: Stacy Cain, female    DOB: 1945-02-25, 69 y.o.   MRN: [id number]  HPI  Here for wellness and f/u;  Overall doing ok;  Pt denies Chest pain, worsening SOB, DOE, wheezing, orthopnea, PND, worsening LE edema, palpitations, dizziness or syncope.  Pt denies neurological change such as new headache, facial or extremity weakness.  Pt denies polydipsia, polyuria, or low sugar symptoms. Pt states overall good compliance with treatment and medications, good tolerability, and has been trying to follow appropriate diet.  Pt denies worsening depressive symptoms, suicidal ideation or panic. No fever, night sweats, wt loss, loss of appetite, or other constitutional symptoms.  Pt states good ability with ADL's, has low fall risk, home safety reviewed and adequate, no other significant changes in hearing or vision, and only occasionally active with exercise.  Seeing endo with better a1c, but renal fxn some worse. Was advised to stop the mobic, but had not taken since aug 2015 left shoulder bursitis episode. Also not taking the diovan, states for years, afraid of side effects of meds. Has only been taking insulin and levoxyl.  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   Has been tyring to drink more po fluids, ready for BMP f/u today.  No other complaints.  Declines to consider even low dose ACE for renoprotection, or statin today.  Declines Prevnar as well Past Medical History  Diagnosis Date  . ANXIETY 10/28/2007  . BACK PAIN 12/23/2007  . CAROTID BRUIT 07/28/2009  . CORONARY ARTERY DISEASE 07/22/2007  . DIABETES MELLITUS, TYPE I 07/22/2007  . GERD 12/23/2007  . HAIR LOSS 02/17/2009  . HYPERLIPIDEMIA 07/22/2007  . HYPERTENSION 07/22/2007  . HYPOTHYROIDISM 07/22/2007  . SKIN LESION 10/27/2007  . URI 12/23/2007   Past Surgical History  Procedure Laterality Date  . S/p coronary stent    . Coronary artery bypass graft    . Cesarean  section      x2  . Tubal ligation      reports that she has never smoked. She does not have any smokeless tobacco history on file. She reports that she does not drink alcohol or use illicit drugs. family history includes Alcohol abuse in her sister; Arthritis in her maternal grandmother; Hypertension in her mother; Melanoma in her mother. Allergies  Allergen Reactions  . Atorvastatin     REACTION: joint pains  . Influenza Vaccines     Patient got bursitis after her vaccine  . Losartan Potassium     REACTION: blurred vision  . Metoprolol Succinate     REACTION: trigger fingers, arm pain  . Olmesartan Medoxomil   . Quinapril Hcl     REACTION: throat swelling   Current Outpatient Prescriptions on File Prior to Visit  Medication Sig Dispense Refill  . Blood Glucose Monitoring Suppl (ONE TOUCH ULTRA 2) W/DEVICE KIT Use to check blood sugar 7 times per day 1 each 0  . calcipotriene-betamethasone (TACLONEX) ointment Apply topically 2 (two) times daily.    Marland Kitchen glucose blood (ONE TOUCH ULTRA TEST) test strip Use to check blood sugar 7 times per day 650 each 1  . insulin lispro (HUMALOG) 100 UNIT/ML injection Use 60 units per day 20 mL 3  . levothyroxine (SYNTHROID, LEVOTHROID) 88 MCG tablet Take 1 tablet (88 mcg total) by mouth daily before breakfast. 30 tablet 3  . urea (CARMOL) 40 % CREA Apply topically 2 (two) times daily.  No current facility-administered medications on file prior to visit.    Review of Systems Constitutional: Negative for increased diaphoresis, other activity, appetite or siginficant weight change other than noted HENT: Negative for worsening hearing loss, ear pain, facial swelling, mouth sores and neck stiffness.   Eyes: Negative for other worsening pain, redness or visual disturbance.  Respiratory: Negative for shortness of breath and wheezing  Cardiovascular: Negative for chest pain and palpitations.  Gastrointestinal: Negative for diarrhea, blood in stool,  abdominal distention or other pain Genitourinary: Negative for hematuria, flank pain or change in urine volume.  Musculoskeletal: Negative for myalgias or other joint complaints.  Skin: Negative for color change and wound or drainage.  Neurological: Negative for syncope and numbness. other than noted Hematological: Negative for adenopathy. or other swelling Psychiatric/Behavioral: Negative for hallucinations, SI, self-injury, decreased concentration or other worsening agitation.      Objective:   Physical Exam BP 122/82 mmHg  Pulse 78  Temp(Src) 98.6 F (37 C) (Oral)  Resp 18  Ht _0  (1.549 m)  Wt 161 lb 1.3 oz (73.065 kg)  BMI 30.45 kg/m2  SpO2 97% VS noted,  Constitutional: Pt is oriented to person, place, and time. Appears well-developed and well-nourished, in no significant distress Head: Normocephalic and atraumatic.  Right Ear: External ear normal.  Left Ear: External ear normal.  Nose: Nose normal.  Mouth/Throat: Oropharynx is clear and moist.  Eyes: Conjunctivae and EOM are normal. Pupils are equal, round, and reactive to light.  Neck: Normal range of motion. Neck supple. No JVD present. No tracheal deviation present or significant neck LA or mass Cardiovascular: Normal rate, regular rhythm, normal heart sounds and intact distal pulses.   Pulmonary/Chest: Effort normal and breath sounds without rales or wheezing  Abdominal: Soft. Bowel sounds are normal. NT. No HSM  Musculoskeletal: Normal range of motion. Exhibits no edema.  Lymphadenopathy:  Has no cervical adenopathy.  Neurological: Pt is alert and oriented to person, place, and time. Pt has normal reflexes. No cranial nerve deficit. Motor grossly intact Skin: Skin is warm and dry. No rash noted.  Psychiatric:  Has normal mood and affect. Behavior is normal.  Lab Results  Component Value Date   WBC 7.9 08/16/2009   HGB 14.1 08/16/2009   HCT 41.3 08/16/2009   PLT 287.0 08/16/2009   GLUCOSE 189* 06/01/2014    CHOL 205* 06/01/2014   TRIG 73.0 06/01/2014   HDL 76.20 06/01/2014   LDLDIRECT 137.3 02/24/2013   LDLCALC 114* 06/01/2014   ALT 19 06/01/2014   AST 21 06/01/2014   NA 134* 06/01/2014   K 4.6 06/01/2014   CL 101 06/01/2014   CREATININE 1.47* 06/01/2014   BUN 14 06/01/2014   CO2 28 06/01/2014   TSH 2.11 06/01/2014   HGBA1C 7.1* 06/01/2014   MICROALBUR 4.9* 06/01/2014       Assessment & Plan:

## 2014-06-08 NOTE — Assessment & Plan Note (Signed)

## 2014-06-08 NOTE — Patient Instructions (Addendum)
Ok to start the Aspirin 81 mg per day   Please continue all other medications as before, and refills have been done if requested.  Please have the pharmacy call with any other refills you may need.  Please continue your efforts at being more active, low cholesterol diet, and weight control.  You are otherwise up to date with prevention measures today.  Please keep your appointments with your specialists as you may have planned  Please go to the LAB in the Basement (turn left off the elevator) for the tests to be done today  You will be contacted by phone if any changes need to be made immediately.  Otherwise, you will receive a letter about your results with an explanation, but please check with MyChart first.  Please remember to sign up for MyChart if you have not done so, as this will be important to you in the future with finding out test results, communicating by private email, and scheduling acute appointments online when needed.  Please return in 1 year for your yearly visit, or sooner if needed

## 2014-06-24 ENCOUNTER — Other Ambulatory Visit: Payer: Self-pay | Admitting: Endocrinology

## 2014-07-15 ENCOUNTER — Other Ambulatory Visit: Payer: Self-pay | Admitting: Endocrinology

## 2014-07-25 LAB — HM DIABETES EYE EXAM

## 2014-08-01 ENCOUNTER — Other Ambulatory Visit: Payer: Self-pay

## 2014-08-01 ENCOUNTER — Other Ambulatory Visit (INDEPENDENT_AMBULATORY_CARE_PROVIDER_SITE_OTHER): Payer: Commercial Managed Care - PPO

## 2014-08-01 ENCOUNTER — Other Ambulatory Visit: Payer: Self-pay | Admitting: *Deleted

## 2014-08-01 DIAGNOSIS — E039 Hypothyroidism, unspecified: Secondary | ICD-10-CM

## 2014-08-01 DIAGNOSIS — E1065 Type 1 diabetes mellitus with hyperglycemia: Secondary | ICD-10-CM | POA: Diagnosis not present

## 2014-08-01 DIAGNOSIS — E785 Hyperlipidemia, unspecified: Secondary | ICD-10-CM | POA: Diagnosis not present

## 2014-08-01 DIAGNOSIS — IMO0002 Reserved for concepts with insufficient information to code with codable children: Secondary | ICD-10-CM

## 2014-08-01 LAB — BASIC METABOLIC PANEL
BUN: 14 mg/dL (ref 6–23)
CALCIUM: 9.4 mg/dL (ref 8.4–10.5)
CO2: 26 meq/L (ref 19–32)
CREATININE: 0.9 mg/dL (ref 0.40–1.20)
Chloride: 102 mEq/L (ref 96–112)
GFR: 66.04 mL/min (ref >60.00)
Glucose, Bld: 183 mg/dL — ABNORMAL HIGH (ref 70–99)
Potassium: 4.7 mEq/L (ref 3.5–5.1)
Sodium: 134 mEq/L — ABNORMAL LOW (ref 135–145)

## 2014-08-01 LAB — LIPID PANEL
Cholesterol: 210 mg/dL — ABNORMAL HIGH (ref 0–200)
HDL: 59.1 mg/dL
LDL Cholesterol: 134 mg/dL — ABNORMAL HIGH (ref 0–99)
NonHDL: 150.9
Total CHOL/HDL Ratio: 4
Triglycerides: 87 mg/dL (ref 0.0–149.0)
VLDL: 17.4 mg/dL (ref 0.0–40.0)

## 2014-08-01 LAB — TSH: TSH: 5.47 u[IU]/mL — ABNORMAL HIGH (ref 0.35–4.50)

## 2014-08-02 LAB — FRUCTOSAMINE: Fructosamine: 292 umol/L — ABNORMAL HIGH (ref 0–285)

## 2014-08-04 ENCOUNTER — Encounter: Payer: Self-pay | Admitting: Internal Medicine

## 2014-08-05 ENCOUNTER — Encounter: Payer: Self-pay | Admitting: Endocrinology

## 2014-08-05 ENCOUNTER — Ambulatory Visit (INDEPENDENT_AMBULATORY_CARE_PROVIDER_SITE_OTHER): Payer: Commercial Managed Care - PPO | Admitting: Endocrinology

## 2014-08-05 VITALS — BP 140/74 | HR 94 | Temp 98.2°F | Resp 15 | Ht 64.0 in | Wt 164.0 lb

## 2014-08-05 DIAGNOSIS — E1065 Type 1 diabetes mellitus with hyperglycemia: Secondary | ICD-10-CM

## 2014-08-05 DIAGNOSIS — E785 Hyperlipidemia, unspecified: Secondary | ICD-10-CM | POA: Diagnosis not present

## 2014-08-05 DIAGNOSIS — I10 Essential (primary) hypertension: Secondary | ICD-10-CM

## 2014-08-05 DIAGNOSIS — E063 Autoimmune thyroiditis: Secondary | ICD-10-CM

## 2014-08-05 DIAGNOSIS — E038 Other specified hypothyroidism: Secondary | ICD-10-CM | POA: Diagnosis not present

## 2014-08-05 DIAGNOSIS — IMO0002 Reserved for concepts with insufficient information to code with codable children: Secondary | ICD-10-CM

## 2014-08-05 NOTE — Progress Notes (Addendum)
Patient ID: Stacy Cain, female   DOB: Apr 11, 1945, 69 y.o.   MRN: [id number]   Reason for Appointment: Insulin Pump followup:   History of Present Illness   Diagnosis: Type 1 DIABETES MELITUS, date of diagnosis:  1974  DIABETES history: She has been on an insulin pump for several years with variable control. She is fairly compliant with monitoring her blood sugars and doing some adjustments of her pump settings but continues to have variability in blood sugar  Her A1c has previously been stable between 7-7.4%  CURRENT insulin pump:  Medtronic   RECENT HISTORY:    PUMP SETTINGS are: Basal rate: Midnight = 0.6; 7 AM = 1.5,  3 PM = 1.0, 5 PM = 0.9 and  9 PM = 0.65  Carb Ratio: 1: 12 Correction factor 1: 50 at 6 AM and 45 at 12 noon; 55 overnight, target 110, active insulin 4 hours  Her A1c still fairly good at 7.1 as of 4/16 but her fructosamine is relatively high at 292 She appears to have changed her basal settings on her pump with increasing her basal rate between 1 PM-3 PM and reducing it at 5 PM and not clear why She is not entering her blood sugars in the pump consistently and still is not able to get the Contour test strips from her insurance despite prior authorization being done  Current blood sugar patterns and problems:   She tends to have high fasting blood sugars, maybe a little better if she takes correction dose for higher readings late at night  She is not entering blood sugars in the pump when she is bolusing for meals and not clear what her blood sugars are during the day except sporadically  She has fairly good blood sugars in the afternoons but may be sporadically high late at night  She thinks she gets hypoglycemic when she is outside shopping especially if she is walking more recently has started disconnecting her pump when she is out shopping, does not actually suspended this as the data is not available on the download  She claims that Humalog makes her sugar come  down quickly and she feels very hypoglycemic even when the blood sugar is only 70.  Her insurance does not cover Novolog anymore and she prefers this  She is probably having frequent small snacks throughout the day as her total carbohydrate intake is as much as 219 g and she has about 7 boluses a day on average  GLUCOSE CONTROL with the pump is assessed today by pump download.  Mean values apply above for all meters except median for One Touch  PRE-MEAL Fasting Lunch Dinner Bedtime Overall  Glucose range:  74-223   68, 145   61-96   63-243    Mean/median:  162     134   122      EXERCISE:She is not doing any exercise even though she has a treadmill at home.  She is mostly walking at shopping areas   Wt Readings from Last 3 Encounters:  08/05/14 164 lb (74.39 kg)  06/08/14 161 lb 1.3 oz (73.065 kg)  05/06/14 165 lb 6.4 oz (75.025 kg)     Lab Results  Component Value Date   HGBA1C 7.1* 06/01/2014   HGBA1C 7.2* 04/27/2014   HGBA1C 7.3* 12/15/2013   Lab Results  Component Value Date   MICROALBUR 4.9* 06/01/2014   LDLCALC 134* 08/01/2014   CREATININE 0.90 08/01/2014     Lab on 08/01/2014  Component  Date Value Ref Range Status  . Sodium 08/01/2014 134* 135 - 145 mEq/L Final  . Potassium 08/01/2014 4.7  3.5 - 5.1 mEq/L Final  . Chloride 08/01/2014 102  96 - 112 mEq/L Final  . CO2 08/01/2014 26  19 - 32 mEq/L Final  . Glucose, Bld 08/01/2014 183* 70 - 99 mg/dL Final  . BUN 08/01/2014 14  6 - 23 mg/dL Final  . Creatinine, Ser 08/01/2014 0.90  0.40 - 1.20 mg/dL Final  . Calcium 08/01/2014 9.4  8.4 - 10.5 mg/dL Final  . GFR 08/01/2014 66.04  >60.00 mL/min Final  . Fructosamine 08/01/2014 292* 0 - 285 umol/L Final   Comment: Published reference interval for apparently healthy subjects between age 25 and 66 is 62 - 285 umol/L and in a poorly controlled diabetic population is 228 - 563 umol/L with a mean of 396 umol/L.   Marland Kitchen Cholesterol 08/01/2014 210* 0 - 200 mg/dL Final    ATP III Classification       Desirable:  < 200 mg/dL               Borderline High:  200 - 239 mg/dL          High:  > = 240 mg/dL  . Triglycerides 08/01/2014 87.0  0.0 - 149.0 mg/dL Final   Normal:  <150 mg/dLBorderline High:  150 - 199 mg/dL  . HDL 08/01/2014 59.10  >39.00 mg/dL Final  . VLDL 08/01/2014 17.4  0.0 - 40.0 mg/dL Final  . LDL Cholesterol 08/01/2014 134* 0 - 99 mg/dL Final  . Total CHOL/HDL Ratio 08/01/2014 4   Final                  Men          Women1/2 Average Risk     3.4          3.3Average Risk          5.0          4.42X Average Risk          9.6          7.13X Average Risk          15.0          11.0                      . NonHDL 08/01/2014 150.90   Final   NOTE:  Non-HDL goal should be 30 mg/dL higher than patient's LDL goal (i.e. LDL goal of < 70 mg/dL, would have non-HDL goal of < 100 mg/dL)  . TSH 08/01/2014 5.47* 0.35 - 4.50 uIU/mL Final      Medication List       This list is accurate as of: 08/05/14  1:08 PM.  Always use your most recent med list.               aspirin EC 81 MG tablet  Take 1 tablet (81 mg total) by mouth daily.     calcipotriene-betamethasone ointment  Commonly known as:  TACLONEX  Apply topically 2 (two) times daily.     HUMALOG 100 UNIT/ML injection  Generic drug:  insulin lispro  INJECT 60 UNITS UNDER THE SKIN DAILY     levothyroxine 88 MCG tablet  Commonly known as:  SYNTHROID, LEVOTHROID  TAKE 1 TABLET BY MOUTH DAILY BEFORE BREAKFAST     ONE TOUCH ULTRA 2 W/DEVICE Kit  Use to check blood sugar 7 times per  day     ONE TOUCH ULTRA TEST test strip  Generic drug:  glucose blood  Use to check blood sugar 7  times per day     urea 40 % Crea  Commonly known as:  CARMOL  Apply topically 2 (two) times daily.        Allergies:  Allergies  Allergen Reactions  . Atorvastatin     REACTION: joint pains  . Influenza Vaccines     Patient got bursitis after her vaccine  . Losartan Potassium     REACTION: blurred vision  .  Metoprolol Succinate     REACTION: trigger fingers, arm pain  . Olmesartan Medoxomil   . Quinapril Hcl     REACTION: throat swelling    Past Medical History  Diagnosis Date  . ANXIETY 10/28/2007  . BACK PAIN 12/23/2007  . CAROTID BRUIT 07/28/2009  . CORONARY ARTERY DISEASE 07/22/2007  . DIABETES MELLITUS, TYPE I 07/22/2007  . GERD 12/23/2007  . HAIR LOSS 02/17/2009  . HYPERLIPIDEMIA 07/22/2007  . HYPERTENSION 07/22/2007  . HYPOTHYROIDISM 07/22/2007  . SKIN LESION 10/27/2007  . URI 12/23/2007    Past Surgical History  Procedure Laterality Date  . S/p coronary stent    . Coronary artery bypass graft    . Cesarean section      x2  . Tubal ligation      Family History  Problem Relation Age of Onset  . Hypertension Mother   . Melanoma Mother   . Alcohol abuse Sister   . Arthritis Maternal Grandmother     Social History:  reports that she has never smoked. She does not have any smokeless tobacco history on file. She reports that she does not drink alcohol or use illicit drugs.  REVIEW of systems:  Eye exam last  in 6/16 , has retinopathy, Stable.  Has not needed laser and quite some time  HYPOTHYROIDISM: She has had long-standing primary hypothyroidism and previously has been on either Armour Thyroid or levothyroxine. She thinks she did not tolerate Armour Thyroid.  She has been taking 88 mcg regularly  With taking her medication in the morning she would tend to forget and is back to taking this at night and is consistent with this Does not feel tired but for some reason her TSH is now slightly high, previously was normal about 2 months ago   Lab Results  Component Value Date   TSH 5.47* 08/01/2014   TSH 2.11 06/01/2014   TSH 38.76* 04/27/2014   FREET4 0.71 04/27/2014   FREET4 0.92 09/08/2013   FREET4 1.12 02/24/2013     ?  Hypertension She has had borderline or high blood pressure readings but still not taking her medications as prescribed.  On her last visit she  had agreed to take a small dose of Diovan especially to prevent retinopathy and nephropathy but she did not start this Again refuses to consider any medications No microalbuminuria Blood pressure is  Relatively high again Home BP 140/70, not clear how often she checks blood pressure  Hyperlipidemia:  On several of her previous visits she has agreed to restart taking Zocor but still has not done this even though she goes to the pharmacy and gets  the medication.  She is still not convinced that she needs to take this   Her lipids are worse.  She tends to have better lipids when she reduces her meat intake  Lab Results  Component Value Date   CHOL 210* 08/01/2014  HDL 59.10 08/01/2014   LDLCALC 134* 08/01/2014   LDLDIRECT 137.3 02/24/2013   TRIG 87.0 08/01/2014   CHOLHDL 4 08/01/2014       EXAM:  BP 142/90 mmHg  Pulse 94  Temp(Src) 98.2 F (36.8 C)  Resp 15  Ht 5' 4"  (1.626 m)  Wt 164 lb (74.39 kg)  BMI 28.14 kg/m2  SpO2 95%  Repeat blood pressure 140/74 Diabetic foot exam shows normal monofilament sensation in the toes and plantar surfaces, no skin lesions or ulcers on the feet and normal pedal pulses   No pedal edema   ASSESSMENT:  DIABETES type 1:   Blood sugars are overall reasonably well-controlled with A1c near 7% again and overall better in the last year  See history of present illness for detailed discussion of current blood sugar patterns, problems identified and current management   Her more significant patterns are notably high fasting blood sugars and tendency to hypoglycemia sporadically around lunch or supper time.  She is tending to have hypoglycemia with more activity like walking in the shopping areas Difficult to assess her blood sugar patterns especially postprandially since she does not enter her blood sugars in her pump consistently and is bolusing without doing so Her insurance continues to deny Contour test strips even after appealing this Only  occasionally will have high postprandial readings at night, still does not use dual wave boluses for higher fat meals Again she has been reluctant to use continuous glucose monitoring although this should benefit her  Recommendations made today:  More frequent glucose monitoringand bring home glucose monitor for download also on each visit  Increase basal rate at 7 AM up to 1.642 hours and then reduce basal rate to 1.45 between 9 AM-3 PM  Use a temporary basal of about 30-50% for increased activity  Consider using extended wave bolus for high fat meals   Follow-up in 3 months with repeat A1c   Hypertension:  She is reluctant to take medications especially ARB drugs as recommended on each visit and just does not want to cooperate with recommendations Will recheck microalbumin on her next visit  HYPOTHYROIDISM:  She needs to increase her dose by half tablet weekly as TSH is trending higher  Hyperlipidemia:  She refuses to start medication again and has been instructed on benefits on each visit previously  Will need follow-up lipids on the next visit  Counseling time on subjects discussed above is over 50% of today's 25 minute visit  Patient Instructions  Take extra 1/2 Synthorid on Sunday  Basal 1.6 from 7-9 and then 1.45 till 3 pm     Sheretta Grumbine 08/05/2014, 1:08 PM

## 2014-08-05 NOTE — Patient Instructions (Addendum)
Take extra 1/2 Synthroid on Sunday  Basal 1.6 from 7-9 and then 1.45 till 3 pm

## 2014-09-13 ENCOUNTER — Telehealth: Payer: Self-pay

## 2014-09-13 DIAGNOSIS — Z1239 Encounter for other screening for malignant neoplasm of breast: Secondary | ICD-10-CM

## 2014-09-13 NOTE — Telephone Encounter (Signed)
Spoke to pt and pt agreed with mammogram order being placed.

## 2014-11-07 ENCOUNTER — Other Ambulatory Visit: Payer: Commercial Managed Care - PPO

## 2014-11-07 DIAGNOSIS — E063 Autoimmune thyroiditis: Secondary | ICD-10-CM

## 2014-11-07 DIAGNOSIS — E1065 Type 1 diabetes mellitus with hyperglycemia: Secondary | ICD-10-CM | POA: Diagnosis not present

## 2014-11-07 DIAGNOSIS — IMO0002 Reserved for concepts with insufficient information to code with codable children: Secondary | ICD-10-CM

## 2014-11-07 DIAGNOSIS — E785 Hyperlipidemia, unspecified: Secondary | ICD-10-CM

## 2014-11-07 LAB — COMPREHENSIVE METABOLIC PANEL
ALT: 14 U/L (ref 0–35)
AST: 20 U/L (ref 0–37)
Albumin: 4.1 g/dL (ref 3.5–5.2)
Alkaline Phosphatase: 96 U/L (ref 39–117)
BUN: 13 mg/dL (ref 6–23)
CHLORIDE: 101 meq/L (ref 96–112)
CO2: 27 meq/L (ref 19–32)
Calcium: 9.6 mg/dL (ref 8.4–10.5)
Creatinine, Ser: 0.86 mg/dL (ref 0.40–1.20)
GFR: 69.54 mL/min (ref >60.00)
GLUCOSE: 101 mg/dL — AB (ref 70–99)
POTASSIUM: 4.5 meq/L (ref 3.5–5.1)
Sodium: 137 mEq/L (ref 135–145)
Total Bilirubin: 0.5 mg/dL (ref 0.2–1.2)
Total Protein: 7.3 g/dL (ref 6.0–8.3)

## 2014-11-07 LAB — TSH: TSH: 1.27 u[IU]/mL (ref 0.35–4.50)

## 2014-11-07 LAB — HEMOGLOBIN A1C: HEMOGLOBIN A1C: 7.8 % — AB (ref 4.6–6.5)

## 2014-11-07 LAB — LIPID PANEL
CHOL/HDL RATIO: 3
Cholesterol: 213 mg/dL — ABNORMAL HIGH (ref 0–200)
HDL: 65.2 mg/dL (ref >39.00)
LDL Cholesterol: 132 mg/dL — ABNORMAL HIGH (ref 0–99)
NonHDL: 147.9
Triglycerides: 82 mg/dL (ref 0.0–149.0)
VLDL: 16.4 mg/dL (ref 0.0–40.0)

## 2014-11-07 LAB — MICROALBUMIN / CREATININE URINE RATIO
CREATININE, U: 294.6 mg/dL
MICROALB UR: 2.4 mg/dL — AB (ref 0.0–1.9)
MICROALB/CREAT RATIO: 0.8 mg/g (ref 0.0–30.0)

## 2014-11-07 LAB — T4, FREE: Free T4: 1.26 ng/dL (ref 0.60–1.60)

## 2014-11-11 ENCOUNTER — Encounter: Payer: Self-pay | Admitting: Endocrinology

## 2014-11-11 ENCOUNTER — Ambulatory Visit (INDEPENDENT_AMBULATORY_CARE_PROVIDER_SITE_OTHER): Payer: Commercial Managed Care - PPO | Admitting: Endocrinology

## 2014-11-11 VITALS — BP 148/78 | HR 86 | Temp 98.4°F | Resp 16 | Ht 61.0 in | Wt 161.2 lb

## 2014-11-11 DIAGNOSIS — E1065 Type 1 diabetes mellitus with hyperglycemia: Secondary | ICD-10-CM | POA: Diagnosis not present

## 2014-11-11 DIAGNOSIS — E785 Hyperlipidemia, unspecified: Secondary | ICD-10-CM | POA: Diagnosis not present

## 2014-11-11 DIAGNOSIS — E038 Other specified hypothyroidism: Secondary | ICD-10-CM

## 2014-11-11 DIAGNOSIS — E063 Autoimmune thyroiditis: Secondary | ICD-10-CM

## 2014-11-11 NOTE — Patient Instructions (Signed)
Start exercise  Change settings

## 2014-11-11 NOTE — Progress Notes (Signed)
Patient ID: Stacy Cain, female   DOB: 1945/11/24, 69 y.o.   MRN: [id number]   Reason for Appointment: Insulin Pump followup:   History of Present Illness   Diagnosis: Type 1 DIABETES MELITUS, date of diagnosis:  1974  DIABETES history: She has been on an insulin pump for several years with variable control. She is fairly compliant with monitoring her blood sugars and doing some adjustments of her pump settings but continues to have variability in blood sugar  Her A1c has previously been stable between 7-7.4%  CURRENT insulin pump:  Medtronic   RECENT HISTORY:   PUMP SETTINGS are: Basal rate: Midnight = 0.6; 7 AM = 1.5,  3 PM = 1.0, 5 PM = 0.9 and 10 PM = 0.7  Carb Ratio: 1: 12 Correction factor 1: 50 at 6 AM and 45 at 12 noon; 55 overnight, target 110, active insulin 4 hours  She was advised to increase her morning basal rate on the last visit but she did not do so. Only change in her settings appears to be using her basal rate of 0.7 at 10 PM instead of 9 PM  Her A1c has gone up to 7.8, previously 7.1 She is not entering her blood sugars in the pump consistently and still is not able to get the Contour test strips from her insurance despite prior authorization being done  Current blood sugar patterns and problems:   She tends to have variable fasting blood sugars, possibly higher if checking earlier in the morning but not consistent recently  HIGHEST blood sugars are after supper and bedtime  She does not think she is getting high fat meals and evenings causing high sugars although maybe a pasta  Blood sugars are variable in the late afternoon and not checked very often  She has frequent snacks throughout the afternoon and evenings and usually does not eat until about 11 AM in the morning; difficult to know which readings are postprandial  Also if her blood sugar is relatively high it may not completely come down to normal in the evenings  GLUCOSE CONTROL with the  pump is assessed today by pump and meter download. Checking blood sugar about 3 times a day on an average  Mean values apply above for all meters except median for One Touch  PRE-MEAL Fasting Lunch  4-7 PM  Bedtime Overall  Glucose range:  55-210    107-210   126-260    Mean/median:  144    154   196   180+/-57    POST-MEAL PC Breakfast PC Lunch PC Dinner  Glucose range:    126-296   Mean/median:       EXERCISE:She is not doing any exercise even though she has a treadmill at home.   She is mostly walking at shopping areas   Wt Readings from Last 3 Encounters:  11/11/14 161 lb 3.2 oz (73.12 kg)  08/05/14 164 lb (74.39 kg)  06/08/14 161 lb 1.3 oz (73.065 kg)     Lab Results  Component Value Date   HGBA1C 7.8* 11/07/2014   HGBA1C 7.1* 06/01/2014   HGBA1C 7.2* 04/27/2014   Lab Results  Component Value Date   MICROALBUR 2.4* 11/07/2014   LDLCALC 132* 11/07/2014   CREATININE 0.86 11/07/2014     Appointment on 11/07/2014  Component Date Value Ref Range Status  . Microalb, Ur 11/07/2014 2.4* 0.0 - 1.9 mg/dL Final  . Creatinine,U 11/07/2014 294.6   Final  . Microalb Creat Ratio  11/07/2014 0.8  0.0 - 30.0 mg/g Final  . Hgb A1c MFr Bld 11/07/2014 7.8* 4.6 - 6.5 % Final   Glycemic Control Guidelines for People with Diabetes:Non Diabetic:  <6%Goal of Therapy: <7%Additional Action Suggested:  >8%   . Sodium 11/07/2014 137  135 - 145 mEq/L Final  . Potassium 11/07/2014 4.5  3.5 - 5.1 mEq/L Final  . Chloride 11/07/2014 101  96 - 112 mEq/L Final  . CO2 11/07/2014 27  19 - 32 mEq/L Final  . Glucose, Bld 11/07/2014 101* 70 - 99 mg/dL Final  . BUN 11/07/2014 13  6 - 23 mg/dL Final  . Creatinine, Ser 11/07/2014 0.86  0.40 - 1.20 mg/dL Final  . Total Bilirubin 11/07/2014 0.5  0.2 - 1.2 mg/dL Final  . Alkaline Phosphatase 11/07/2014 96  39 - 117 U/L Final  . AST 11/07/2014 20  0 - 37 U/L Final  . ALT 11/07/2014 14  0 - 35 U/L Final  . Total Protein 11/07/2014 7.3  6.0 - 8.3 g/dL  Final  . Albumin 11/07/2014 4.1  3.5 - 5.2 g/dL Final  . Calcium 11/07/2014 9.6  8.4 - 10.5 mg/dL Final  . GFR 11/07/2014 69.54  >60.00 mL/min Final  . Cholesterol 11/07/2014 213* 0 - 200 mg/dL Final   ATP III Classification       Desirable:  < 200 mg/dL               Borderline High:  200 - 239 mg/dL          High:  > = 240 mg/dL  . Triglycerides 11/07/2014 82.0  0.0 - 149.0 mg/dL Final   Normal:  <150 mg/dLBorderline High:  150 - 199 mg/dL  . HDL 11/07/2014 65.20  >39.00 mg/dL Final  . VLDL 11/07/2014 16.4  0.0 - 40.0 mg/dL Final  . LDL Cholesterol 11/07/2014 132* 0 - 99 mg/dL Final  . Total CHOL/HDL Ratio 11/07/2014 3   Final                  Men          Women1/2 Average Risk     3.4          3.3Average Risk          5.0          4.42X Average Risk          9.6          7.13X Average Risk          15.0          11.0                      . NonHDL 11/07/2014 147.90   Final   NOTE:  Non-HDL goal should be 30 mg/dL higher than patient's LDL goal (i.e. LDL goal of < 70 mg/dL, would have non-HDL goal of < 100 mg/dL)  . TSH 11/07/2014 1.27  0.35 - 4.50 uIU/mL Final  . Free T4 11/07/2014 1.26  0.60 - 1.60 ng/dL Final      Medication List       This list is accurate as of: 11/11/14 10:12 AM.  Always use your most recent med list.               aspirin EC 81 MG tablet  Take 1 tablet (81 mg total) by mouth daily.     calcipotriene-betamethasone ointment  Commonly known as:  TACLONEX  Apply topically  2 (two) times daily.     HUMALOG 100 UNIT/ML injection  Generic drug:  insulin lispro  INJECT 60 UNITS UNDER THE SKIN DAILY     levothyroxine 88 MCG tablet  Commonly known as:  SYNTHROID, LEVOTHROID  TAKE 1 TABLET BY MOUTH DAILY BEFORE BREAKFAST     ONE TOUCH ULTRA 2 W/DEVICE Kit  Use to check blood sugar 7 times per day     ONE TOUCH ULTRA TEST test strip  Generic drug:  glucose blood  Use to check blood sugar 7  times per day     urea 40 % Crea  Commonly known as:  CARMOL    Apply topically 2 (two) times daily.        Allergies:  Allergies  Allergen Reactions  . Atorvastatin     REACTION: joint pains  . Influenza Vaccines     Patient got bursitis after her vaccine  . Losartan Potassium     REACTION: blurred vision  . Metoprolol Succinate     REACTION: trigger fingers, arm pain  . Olmesartan Medoxomil   . Quinapril Hcl     REACTION: throat swelling    Past Medical History  Diagnosis Date  . ANXIETY 10/28/2007  . BACK PAIN 12/23/2007  . CAROTID BRUIT 07/28/2009  . CORONARY ARTERY DISEASE 07/22/2007  . DIABETES MELLITUS, TYPE I 07/22/2007  . GERD 12/23/2007  . HAIR LOSS 02/17/2009  . HYPERLIPIDEMIA 07/22/2007  . HYPERTENSION 07/22/2007  . HYPOTHYROIDISM 07/22/2007  . SKIN LESION 10/27/2007  . URI 12/23/2007    Past Surgical History  Procedure Laterality Date  . S/p coronary stent    . Coronary artery bypass graft    . Cesarean section      x2  . Tubal ligation      Family History  Problem Relation Age of Onset  . Hypertension Mother   . Melanoma Mother   . Alcohol abuse Sister   . Arthritis Maternal Grandmother     Social History:  reports that she has never smoked. She does not have any smokeless tobacco history on file. She reports that she does not drink alcohol or use illicit drugs.  REVIEW of systems:  Eye exam last  in 6/16 , has retinopathy, Stable.  Has not needed laser and quite some time  HYPOTHYROIDISM: She has had long-standing primary hypothyroidism and previously has been on either Armour Thyroid or levothyroxine. She thinks she did not tolerate Armour Thyroid.  She has been taking 88 mcg regularly qd, was told to take an extra half tablet weekly but forgot to do this  With taking her medication in the morning she would tend to forget and is taking this at night and is consistent with this  TSH is back to normal  Lab Results  Component Value Date   TSH 1.27 11/07/2014   TSH 5.47* 08/01/2014   TSH 2.11 06/01/2014    FREET4 1.26 11/07/2014   FREET4 0.71 04/27/2014   FREET4 0.92 09/08/2013     ?  Hypertension She has had borderline or high blood pressure readings but refuses to take any medication Previously she had agreed to take a small dose of Diovan especially to prevent retinopathy and nephropathy but she did not start this No microalbuminuria Blood pressure is relatively higher in the office Home BP still about 140/70, she thinks she checks regularly  Hyperlipidemia:  On several of her previous visits she has agreed to restart taking Zocor but still has not done this even  though she goes to the pharmacy and gets  the medication.   She is still not convinced that she needs to take this   Her lipids are not controlled.  She tends to have better lipids when she reduces her meat intake  Lab Results  Component Value Date   CHOL 213* 11/07/2014   HDL 65.20 11/07/2014   LDLCALC 132* 11/07/2014   LDLDIRECT 137.3 02/24/2013   TRIG 82.0 11/07/2014   CHOLHDL 3 11/07/2014     Diabetic foot exam 7/16: shows normal monofilament sensation in the toes and plantar surfaces, no skin lesions or ulcers on the feet and normal pedal pulses  EXAM:  BP 148/78 mmHg  Pulse 86  Temp(Src) 98.4 F (36.9 C)  Resp 16  Ht 5' 1"  (1.549 m)  Wt 161 lb 3.2 oz (73.12 kg)  BMI 30.47 kg/m2  SpO2 96%  Repeat blood pressure 164/76   No pedal edema   ASSESSMENT:  DIABETES type 1:   Blood sugars are overall relatively higher and not clear why A1c is 7.8 now See history of present illness for detailed discussion of current blood sugar patterns, problems identified and current management   She does need to check her blood sugars more consistently throughout the day including midday and afternoon when she is not usually checking them Although she has some variability in blood sugars at all times they are trending progressively higher in the afternoon and evening This is despite not usually having high fat meals  and counting carbohydrates Because of her multiple snacks and small meals difficult to know which readings are truly postprandial; however most likely needs greater coverage of carbohydrates before evening meals Her diet appears to be relatively low fat Have discussed CGM before and she has not wanted to do this  Her insurance continues to deny Contour test strips even after appealing this  Recommendations made today:  More frequent glucose   Increase basal rates at 5 PM until midnight, she will use 1.0 basal rate at 5 PM, on 0.05 at 6 PM and 0.85 at 10 PM  Consider increasing basal rates in the afternoon if blood sugars are consistently high  Carbohydrate coverage of 1:10 at suppertime , shoulder how to do this  Follow-up in 3 months with repeat A1c   Hypertension:  She is reluctant to take medications especially ARB drugs as recommended on each visit Advised her that if her blood pressure is any higher will need to start her on valsartan  HYPOTHYROIDISM:  She will continue same dose  Hyperlipidemia:  She refuses to start medication again  Will need follow-up lipids annually  Counseling time on subjects discussed above is over 50% of today's 25 minute visit  Patient Instructions  Start exercise  Change settings     [facility] 11/11/2014, 10:12 AM

## 2014-12-02 ENCOUNTER — Other Ambulatory Visit: Payer: Self-pay | Admitting: Endocrinology

## 2014-12-05 ENCOUNTER — Other Ambulatory Visit: Payer: Self-pay

## 2014-12-05 MED ORDER — LEVOTHYROXINE SODIUM 88 MCG PO TABS: ORAL_TABLET | ORAL | Status: DC

## 2015-01-10 ENCOUNTER — Other Ambulatory Visit: Payer: Self-pay | Admitting: Endocrinology

## 2015-02-03 ENCOUNTER — Telehealth: Payer: Self-pay | Admitting: Internal Medicine

## 2015-02-03 NOTE — Telephone Encounter (Signed)
Very sorry, but I cannot order labs at that office.

## 2015-02-03 NOTE — Telephone Encounter (Signed)
Patient is requesting to have lab orders sent over to Dr. Remus BlakeKumar's office to be tested for limes disease. [facility] and would like to have all at once.  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

## 2015-02-07 ENCOUNTER — Other Ambulatory Visit (INDEPENDENT_AMBULATORY_CARE_PROVIDER_SITE_OTHER): Payer: Commercial Managed Care - PPO

## 2015-02-07 DIAGNOSIS — E1065 Type 1 diabetes mellitus with hyperglycemia: Secondary | ICD-10-CM

## 2015-02-07 LAB — BASIC METABOLIC PANEL
BUN: 16 mg/dL (ref 6–23)
CALCIUM: 9.6 mg/dL (ref 8.4–10.5)
CO2: 28 meq/L (ref 19–32)
CREATININE: 0.92 mg/dL (ref 0.40–1.20)
Chloride: 102 mEq/L (ref 96–112)
GFR: 64.29 mL/min (ref >60.00)
Glucose, Bld: 118 mg/dL — ABNORMAL HIGH (ref 70–99)
Potassium: 4.6 mEq/L (ref 3.5–5.1)
SODIUM: 137 meq/L (ref 135–145)

## 2015-02-07 LAB — HEMOGLOBIN A1C: Hgb A1c MFr Bld: 7.9 % — ABNORMAL HIGH (ref 4.6–6.5)

## 2015-02-08 NOTE — Telephone Encounter (Signed)
Can labs be sent down to our lab?

## 2015-02-08 NOTE — Telephone Encounter (Signed)
I would not like either option  I think pt should have lab done as instructed per Dr Lucianne MussKumar, but I cannot be responsible for the ordering of the lab

## 2015-02-08 NOTE — Telephone Encounter (Signed)
She would like to know if you would enter the order based on the info given but [facility] or does patient need to make OV?

## 2015-02-08 NOTE — Telephone Encounter (Signed)
If you mean can I place the order for Dr Lucianne MussKumar, the answer would be the same, thanks

## 2015-02-10 ENCOUNTER — Other Ambulatory Visit: Payer: Self-pay | Admitting: *Deleted

## 2015-02-10 ENCOUNTER — Ambulatory Visit (INDEPENDENT_AMBULATORY_CARE_PROVIDER_SITE_OTHER): Payer: Commercial Managed Care - PPO | Admitting: Endocrinology

## 2015-02-10 ENCOUNTER — Encounter: Payer: Self-pay | Admitting: Endocrinology

## 2015-02-10 VITALS — BP 140/68 | HR 83 | Temp 97.9°F | Resp 14 | Ht 61.0 in | Wt 165.2 lb

## 2015-02-10 DIAGNOSIS — E038 Other specified hypothyroidism: Secondary | ICD-10-CM

## 2015-02-10 DIAGNOSIS — E1065 Type 1 diabetes mellitus with hyperglycemia: Secondary | ICD-10-CM | POA: Diagnosis not present

## 2015-02-10 DIAGNOSIS — E063 Autoimmune thyroiditis: Secondary | ICD-10-CM

## 2015-02-10 MED ORDER — SIMVASTATIN 40 MG PO TABS: 40.0000 mg | ORAL_TABLET | Freq: Every day | ORAL | Status: DC

## 2015-02-10 MED ORDER — ONETOUCH ULTRA 2 W/DEVICE KIT: PACK | Status: DC

## 2015-02-10 NOTE — Telephone Encounter (Signed)
Got scheduled appointment for OV with Dr. [name].

## 2015-02-10 NOTE — Patient Instructions (Signed)
Check more often

## 2015-02-10 NOTE — Progress Notes (Signed)
Patient ID: Stacy Cain, female   DOB: 24-Jul-1945, 70 y.o.   MRN: [id number]   Reason for Appointment: Insulin Pump followup:   History of Present Illness   Diagnosis: Type 1 DIABETES MELITUS, date of diagnosis:  1974  DIABETES history: She has been on an insulin pump for several years with variable control. She is fairly compliant with monitoring her blood sugars and doing some adjustments of her pump settings but continues to have variability in blood sugar  Her A1c has previously been stable between 7-7.4%  CURRENT insulin pump:  [employer]tronic   RECENT HISTORY:   PUMP SETTINGS are: Basal rate: Midnight = 0.6; 7 AM = 1.5,  3 PM = 1.0, 5 PM = 0.9 and 10 PM = 0.7  Carb Ratio: 1: 12 Correction factor 1: 50 at 6 AM and 45 at 12 noon; 55 overnight, target 110, active insulin 4 hours  She was advised to increase her morning basal rate on the last visit but she did not do so. Only change in her settings appears to be using her basal rate of 0.7 at 10 PM instead of 9 PM  Her A1c has gone up to 7.8, previously 7.1 She is not entering her blood sugars in the pump consistently and still is not able to get the Contour test strips from her insurance despite prior authorization being done  Current blood sugar patterns and problems:   She tends to have mostly high readings at bedtime especially recently  This is despite not eating any excessive snacking in the evenings and avoiding high-fat foods  Fasting readings are usually better although not consistent.  She tends to get up late in the morning after about 10-11 AM on most days  Blood sugars are not being checked during the day and difficult to get a pattern, occasionally higher around suppertime  She does not test as much at mealtimes before eating as her meter is not in her kitchen  She gets low blood sugars when she is shopping and does not know how to use a temporary basal rate.  She tried to suspend the pump on one occasion but  this caused high readings since she forgot to turn it back on  GLUCOSE CONTROL with the pump is assessed today by pump and meter download. Checking blood sugar about 3 times a day on an average  Mean values apply above for all meters except median for One Touch  PRE-MEAL Fasting Lunch Dinner Bedtime Overall  Glucose range:  103-229   109-184   72-243   91-355    Mean/median:  142    128    148+/-75      EXERCISE:She is not doing any exercise even though she has a treadmill at home.   She is mostly walking at shopping locations   Wt Readings from Last 3 Encounters:  02/10/15 165 lb 3.2 oz (74.934 kg)  11/11/14 161 lb 3.2 oz (73.12 kg)  08/05/14 164 lb (74.39 kg)     Lab Results  Component Value Date   HGBA1C 7.9* 02/07/2015   HGBA1C 7.8* 11/07/2014   HGBA1C 7.1* 06/01/2014   Lab Results  Component Value Date   MICROALBUR 2.4* 11/07/2014   LDLCALC 132* 11/07/2014   CREATININE 0.92 02/07/2015     Lab on 02/07/2015  Component Date Value Ref Range Status  . Hgb A1c MFr Bld 02/07/2015 7.9* 4.6 - 6.5 % Final   Glycemic Control Guidelines for People with Diabetes:Non Diabetic:  <  6%Goal of Therapy: <7%Additional Action Suggested:  >8%   . Sodium 02/07/2015 137  135 - 145 mEq/L Final  . Potassium 02/07/2015 4.6  3.5 - 5.1 mEq/L Final  . Chloride 02/07/2015 102  96 - 112 mEq/L Final  . CO2 02/07/2015 28  19 - 32 mEq/L Final  . Glucose, Bld 02/07/2015 118* 70 - 99 mg/dL Final  . BUN 02/07/2015 16  6 - 23 mg/dL Final  . Creatinine, Ser 02/07/2015 0.92  0.40 - 1.20 mg/dL Final  . Calcium 02/07/2015 9.6  8.4 - 10.5 mg/dL Final  . GFR 02/07/2015 64.29  >60.00 mL/min Final      Medication List       This list is accurate as of: 02/10/15 10:56 AM.  Always use your most recent med list.               calcipotriene-betamethasone ointment  Commonly known as:  TACLONEX  Apply topically 2 (two) times daily.     HUMALOG 100 UNIT/ML injection  Generic drug:  insulin lispro    INJECT 60 UNITS UNDER THE SKIN DAILY     levothyroxine 88 MCG tablet  Commonly known as:  SYNTHROID, LEVOTHROID  TAKE 1 TABLET BY MOUTH DAILY BEFORE BREAKFAST     ONE TOUCH ULTRA 2 w/Device Kit  Use to check blood sugar 7 times per day E10.65     ONE TOUCH ULTRA TEST test strip  Generic drug:  glucose blood  Use to check blood sugar 7  times per day     urea 40 % Crea  Commonly known as:  CARMOL  Apply topically 2 (two) times daily.        Allergies:  Allergies  Allergen Reactions  . Atorvastatin     REACTION: joint pains  . Influenza Vaccines     Patient got bursitis after her vaccine  . Losartan Potassium     REACTION: blurred vision  . Metoprolol Succinate     REACTION: trigger fingers, arm pain  . Olmesartan Medoxomil   . Quinapril Hcl     REACTION: throat swelling    Past Medical History  Diagnosis Date  . ANXIETY 10/28/2007  . BACK PAIN 12/23/2007  . CAROTID BRUIT 07/28/2009  . CORONARY ARTERY DISEASE 07/22/2007  . DIABETES MELLITUS, TYPE I 07/22/2007  . GERD 12/23/2007  . HAIR LOSS 02/17/2009  . HYPERLIPIDEMIA 07/22/2007  . HYPERTENSION 07/22/2007  . HYPOTHYROIDISM 07/22/2007  . SKIN LESION 10/27/2007  . URI 12/23/2007    Past Surgical History  Procedure Laterality Date  . S/p coronary stent    . Coronary artery bypass graft    . Cesarean section      x2  . Tubal ligation      Family History  Problem Relation Age of Onset  . Hypertension Mother   . Melanoma Mother   . Alcohol abuse Sister   . Arthritis Maternal Grandmother     Social History:  reports that she has never smoked. She does not have any smokeless tobacco history on file. She reports that she does not drink alcohol or use illicit drugs.  REVIEW of systems:  Eye exam last  in 6/16 , has retinopathy, Stable.  Has not needed laser and quite some time  HYPOTHYROIDISM: She has had long-standing primary hypothyroidism and previously has been on either Armour Thyroid or levothyroxine.  She thinks she did not tolerate Armour Thyroid.  She has been taking 88 mcg regularly qd, was told to take an extra  half tablet weekly but forgot to do this  With taking her medication in the morning she would tend to forget and is taking this at night and is consistent with this  TSH is back to normal  Lab Results  Component Value Date   TSH 1.27 11/07/2014   TSH 5.47* 08/01/2014   TSH 2.11 06/01/2014   FREET4 1.26 11/07/2014   FREET4 0.71 04/27/2014   FREET4 0.92 09/08/2013     ?  Hypertension She has had borderline or high blood pressure readings but refuses to take any medication Previously she had agreed to take a small dose of Diovan especially to prevent retinopathy and nephropathy but she did not start this No microalbuminuria Home BP is usually 165 systolic  Hyperlipidemia:  On several of her previous visits she has agreed to restart taking Zocor but still has not done this even though it has been explained to her why she needs to do this   Her lipids are not controlled.  She tends to have better lipids when she reduces her meat intake  Lab Results  Component Value Date   CHOL 213* 11/07/2014   HDL 65.20 11/07/2014   LDLCALC 132* 11/07/2014   LDLDIRECT 137.3 02/24/2013   TRIG 82.0 11/07/2014   CHOLHDL 3 11/07/2014     Diabetic foot exam 7/16: shows normal monofilament sensation in the toes and plantar surfaces, no skin lesions or ulcers on the feet and normal pedal pulses  EXAM:  BP 140/68 mmHg  Pulse 83  Temp(Src) 97.9 F (36.6 C)  Resp 14  Ht 5' 1"  (1.549 m)  Wt 165 lb 3.2 oz (74.934 kg)  BMI 31.23 kg/m2  SpO2 97%     ASSESSMENT:  DIABETES type 1:  See history of present illness for detailed discussion of his current management, blood sugar patterns and problems identified  Blood sugars are overall relatively high and mostly late at night after midnight because of her stating up in the evenings later and her basal rate goes down to 0.6 at  midnight  A1c is 7.9 and higher than usual She thinks her sugars are better controlled with Novolog but not able to get this from her insurance    Recommendations made today:  More frequent glucose  Monitoring, will prescribe another glucose meter    she will try to get the contour test strips from her insurance company  Increase basal rates at  [place] to 0.7  for a duration of 3 hours   Consider  Continuous glucose monitoring   Use of 50% temporary basal when going out shopping for the duration of shopping , showed her how to do this  Follow-up in 3 months with repeat A1c  Regular exercise  Moderate amounts of carbohydrates   Hypertension:  She is reluctant to take medications especially ARB drugs as recommended on each visit and will need to follow-up microalbumin periodically  HYPOTHYROIDISM:  She will continue same dose  Hyperlipidemia:  She agrees to take the medication the month before she comes back for her follow-up Will need follow-up lipids annually  Counseling time on subjects discussed above is over 50% of today's 25 minute visit  Patient Instructions  Check more often     Jourdyn Ferrin 02/10/2015, 10:56 AM

## 2015-02-10 NOTE — Progress Notes (Deleted)
MOST OF HERot clear why A1c is 7.8 now See history of present illness for detailed discussion of current blood sugar patterns, problems identified and current management   She does need to check her blood sugars more consistently throughout the day including midday and afternoon when she is not usually checking them Although she has some variability in blood sugars at all times they are trending progressively higher in the afternoon and evening This is despite not usually having high fat meals and counting carbohydrates Because of her multiple snacks and small meals difficult to know which readings are truly postprandial; however most likely needs greater coverage of carbohydrates before evening meals Her diet appears to be relatively low fat Have discussed CGM before and she has not wanted to do this  Her insurance continues to deny Contour test strips even after appealing this Recommendations made today:  More frequent glucose  Monitoring, will prescribe another glucose meter    she will try to get the contour test strips from her insurance company  Increase basal rates at  Midnight  Up to 0.7  for a duration of 3 hours   Consider  Continuous glucose monitoring   Use of 50% temporary basal when going out shopping for the duration of shopping , showed her how to do this  Follow-up in 3 months with repeat A1c   HYPOTHYROIDISM:  She will continue same dose , needs follow-up TSH on the next visit  Hyperlipidemia:  She agrees to  Try simvastatin for at least 30 days prior to her next visit   Counseling time on subjects discussed above is over 50% of today's 25 minute visit  Patient Instructions  Check more often     Stacy Cain 02/10/2015, 10:21 AM

## 2015-02-16 ENCOUNTER — Other Ambulatory Visit (INDEPENDENT_AMBULATORY_CARE_PROVIDER_SITE_OTHER): Payer: Commercial Managed Care - PPO

## 2015-02-16 ENCOUNTER — Ambulatory Visit (INDEPENDENT_AMBULATORY_CARE_PROVIDER_SITE_OTHER): Payer: Commercial Managed Care - PPO | Admitting: Internal Medicine

## 2015-02-16 VITALS — BP 162/74 | HR 86 | Temp 99.0°F | Ht 61.0 in | Wt 165.0 lb

## 2015-02-16 DIAGNOSIS — I10 Essential (primary) hypertension: Secondary | ICD-10-CM | POA: Diagnosis not present

## 2015-02-16 DIAGNOSIS — W57XXXA Bitten or stung by nonvenomous insect and other nonvenomous arthropods, initial encounter: Secondary | ICD-10-CM

## 2015-02-16 DIAGNOSIS — T148 Other injury of unspecified body region: Secondary | ICD-10-CM | POA: Diagnosis not present

## 2015-02-16 DIAGNOSIS — E1065 Type 1 diabetes mellitus with hyperglycemia: Secondary | ICD-10-CM

## 2015-02-16 DIAGNOSIS — L84 Corns and callosities: Secondary | ICD-10-CM | POA: Insufficient documentation

## 2015-02-16 DIAGNOSIS — Z0000 Encounter for general adult medical examination without abnormal findings: Secondary | ICD-10-CM | POA: Diagnosis not present

## 2015-02-16 LAB — URINALYSIS, ROUTINE W REFLEX MICROSCOPIC
BILIRUBIN URINE: NEGATIVE
HGB URINE DIPSTICK: NEGATIVE
KETONES UR: NEGATIVE
LEUKOCYTES UA: NEGATIVE
NITRITE: NEGATIVE
PH: 6.5 (ref 5.0–8.0)
RBC / HPF: NONE SEEN (ref 0–?)
Specific Gravity, Urine: 1.01 (ref 1.000–1.030)
Total Protein, Urine: NEGATIVE
UROBILINOGEN UA: 0.2 (ref 0.0–1.0)
Urine Glucose: NEGATIVE
WBC UA: NONE SEEN (ref 0–?)

## 2015-02-16 LAB — CBC WITH DIFFERENTIAL/PLATELET
Basophils Absolute: 0.1 10*3/uL (ref 0.0–0.1)
Basophils Relative: 0.7 % (ref 0.0–3.0)
Eosinophils Absolute: 0.2 10*3/uL (ref 0.0–0.7)
Eosinophils Relative: 2.3 % (ref 0.0–5.0)
HCT: 42.4 % (ref 36.0–46.0)
Hemoglobin: 14.2 g/dL (ref 12.0–15.0)
Lymphocytes Relative: 20.3 % (ref 12.0–46.0)
Lymphs Abs: 1.7 10*3/uL (ref 0.7–4.0)
MCHC: 33.4 g/dL (ref 30.0–36.0)
MCV: 88.7 fl (ref 78.0–100.0)
Monocytes Absolute: 0.4 10*3/uL (ref 0.1–1.0)
Monocytes Relative: 4.4 % (ref 3.0–12.0)
Neutro Abs: 6.1 10*3/uL (ref 1.4–7.7)
Neutrophils Relative %: 72.3 % (ref 43.0–77.0)
Platelets: 317 10*3/uL (ref 150.0–400.0)
RBC: 4.78 Mil/uL (ref 3.87–5.11)
RDW: 14.2 % (ref 11.5–15.5)
WBC: 8.4 10*3/uL (ref 4.0–10.5)

## 2015-02-16 LAB — LIPID PANEL
CHOL/HDL RATIO: 3
Cholesterol: 253 mg/dL — ABNORMAL HIGH (ref 0–200)
HDL: 76.6 mg/dL (ref >39.00)
LDL CALC: 157 mg/dL — AB (ref 0–99)
NonHDL: 175.9
TRIGLYCERIDES: 93 mg/dL (ref 0.0–149.0)
VLDL: 18.6 mg/dL (ref 0.0–40.0)

## 2015-02-16 LAB — HEPATIC FUNCTION PANEL
ALT: 11 U/L (ref 0–35)
AST: 17 U/L (ref 0–37)
Albumin: 4.4 g/dL (ref 3.5–5.2)
Alkaline Phosphatase: 86 U/L (ref 39–117)
Bilirubin, Direct: 0.1 mg/dL (ref 0.0–0.3)
Total Bilirubin: 0.5 mg/dL (ref 0.2–1.2)
Total Protein: 7.5 g/dL (ref 6.0–8.3)

## 2015-02-16 LAB — TSH: TSH: 4.38 u[IU]/mL (ref 0.35–4.50)

## 2015-02-16 LAB — MICROALBUMIN / CREATININE URINE RATIO
Creatinine,U: 136 mg/dL
MICROALB UR: 0.7 mg/dL (ref 0.0–1.9)
Microalb Creat Ratio: 0.5 mg/g (ref 0.0–30.0)

## 2015-02-16 NOTE — Patient Instructions (Signed)
Please continue all other medications as before, and refills have been done if requested.  Please have the pharmacy call with any other refills you may need.  Please continue your efforts at being more active, low cholesterol diet, and weight control.  You are otherwise up to date with prevention measures today.  Please keep your appointments with your specialists as you may have planned  You will be contacted regarding the referral for: podiatry  Please go to the LAB in the Basement (turn left off the elevator) for the tests to be done today  You will be contacted by phone if any changes need to be made immediately.  Otherwise, you will receive a letter about your results with an explanation, but please check with MyChart first.  Please remember to sign up for MyChart if you have not done so, as this will be important to you in the future with finding out test results, communicating by private email, and scheduling acute appointments online when needed.  Please return in 1 year for your yearly visit, or sooner if needed, with Lab testing done 3-5 days before  

## 2015-02-16 NOTE — Assessment & Plan Note (Signed)
No rash currently, bite healed, ok for lyme dz ab

## 2015-02-16 NOTE — Progress Notes (Signed)
Pre visit review using our clinic review tool, if applicable. No additional management support is needed unless otherwise documented below in the visit note. 

## 2015-02-16 NOTE — Assessment & Plan Note (Signed)
Ok for podiatry referral 

## 2015-02-16 NOTE — Assessment & Plan Note (Signed)
Sees endo, also for yearly required urine microalbumin Lab Results  Component Value Date   HGBA1C 7.9* 02/07/2015

## 2015-02-16 NOTE — Assessment & Plan Note (Signed)

## 2015-02-16 NOTE — Assessment & Plan Note (Signed)
elev today, BP usually better controlled at home per pt, stable overall by history and exam, recent data reviewed with pt, and pt to continue medical treatment as before,  to f/u any worsening symptoms or concerns BP Readings from Last 3 Encounters:  02/16/15 162/74  02/10/15 140/68  11/11/14 148/78

## 2015-02-16 NOTE — Progress Notes (Signed)
Subjective:    Patient ID: Stacy Cain, female    DOB: 07/24/1945, 70 y.o.   MRN: [id number]  HPI   Here for wellness and f/u;  Overall doing ok;  Pt denies Chest pain, worsening SOB, DOE, wheezing, orthopnea, PND, worsening LE edema, palpitations, dizziness or syncope.  Pt denies neurological change such as new headache, facial or extremity weakness.  Pt denies polydipsia, polyuria, or low sugar symptoms. Pt states overall good compliance with treatment and medications, good tolerability, and has been trying to follow appropriate diet.  Pt denies worsening depressive symptoms, suicidal ideation or panic. No fever, night sweats, wt loss, loss of appetite, or other constitutional symptoms.  Pt states good ability with ADL's, has low fall risk, home safety reviewed and adequate, no other significant changes in hearing or vision, and only occasionally active with exercise. Also one of her dogs was diagnosed with lymes dz, pt has personal hx of tick bite, not sure about rash with this., to right lower abd/side. Declines flu shot. And tetanus. Past Medical History  Diagnosis Date  . ANXIETY 10/28/2007  . BACK PAIN 12/23/2007  . CAROTID BRUIT 07/28/2009  . CORONARY ARTERY DISEASE 07/22/2007  . DIABETES MELLITUS, TYPE I 07/22/2007  . GERD 12/23/2007  . HAIR LOSS 02/17/2009  . HYPERLIPIDEMIA 07/22/2007  . HYPERTENSION 07/22/2007  . HYPOTHYROIDISM 07/22/2007  . SKIN LESION 10/27/2007  . URI 12/23/2007   Past Surgical History  Procedure Laterality Date  . S/p coronary stent    . Coronary artery bypass graft    . Cesarean section      x2  . Tubal ligation      reports that she has never smoked. She does not have any smokeless tobacco history on file. She reports that she does not drink alcohol or use illicit drugs. family history includes Alcohol abuse in her sister; Arthritis in her maternal grandmother; Hypertension in her mother; Melanoma in her mother. Allergies  Allergen Reactions  . Atorvastatin      REACTION: joint pains  . Influenza Vaccines     Patient got bursitis after her vaccine  . Losartan Potassium     REACTION: blurred vision  . Metoprolol Succinate     REACTION: trigger fingers, arm pain  . Olmesartan Medoxomil   . Quinapril Hcl     REACTION: throat swelling   Current Outpatient Prescriptions on File Prior to Visit  Medication Sig Dispense Refill  . Blood Glucose Monitoring Suppl (ONE TOUCH ULTRA 2) w/Device KIT Use to check blood sugar 7 times per day E10.65 1 each 0  . calcipotriene-betamethasone (TACLONEX) ointment Apply topically 2 (two) times daily.    Marland Kitchen HUMALOG 100 UNIT/ML injection INJECT 60 UNITS UNDER THE SKIN DAILY 20 mL 3  . levothyroxine (SYNTHROID, LEVOTHROID) 88 MCG tablet TAKE 1 TABLET BY MOUTH DAILY BEFORE BREAKFAST 30 tablet 3  . ONE TOUCH ULTRA TEST test strip Use to check blood sugar 7  times per day 200 each 5  . simvastatin (ZOCOR) 40 MG tablet Take 1 tablet (40 mg total) by mouth at bedtime. 30 tablet 3  . urea (CARMOL) 40 % CREA Apply topically 2 (two) times daily.     No current facility-administered medications on file prior to visit.   Review of Systems Constitutional: Negative for increased diaphoresis, other activity, appetite or siginficant weight change other than noted HENT: Negative for worsening hearing loss, ear pain, facial swelling, mouth sores and neck stiffness.   Eyes: Negative for other worsening pain,  redness or visual disturbance.  Respiratory: Negative for shortness of breath and wheezing  Cardiovascular: Negative for chest pain and palpitations.  Gastrointestinal: Negative for diarrhea, blood in stool, abdominal distention or other pain Genitourinary: Negative for hematuria, flank pain or change in urine volume.  Musculoskeletal: Negative for myalgias or other joint complaints.  Skin: Negative for color change and wound or drainage.  Neurological: Negative for syncope and numbness. other than noted Hematological:  Negative for adenopathy. or other swelling Psychiatric/Behavioral: Negative for hallucinations, SI, self-injury, decreased concentration or other worsening agitation.      Objective:   Physical Exam BP 162/74 mmHg  Pulse 86  Temp(Src) 99 F (37.2 C) (Oral)  Ht 5' 1"  (1.549 m)  Wt 165 lb (74.844 kg)  BMI 31.19 kg/m2  SpO2 95% VS noted,  Constitutional: Pt is oriented to person, place, and time. Appears well-developed and well-nourished, in no significant distress Head: Normocephalic and atraumatic.  Right Ear: External ear normal.  Left Ear: External ear normal.  Nose: Nose normal.  Mouth/Throat: Oropharynx is clear and moist.  Eyes: Conjunctivae and EOM are normal. Pupils are equal, round, and reactive to light.  Neck: Normal range of motion. Neck supple. No JVD present. No tracheal deviation present or significant neck LA or mass Cardiovascular: Normal rate, regular rhythm, normal heart sounds and intact distal pulses.   Pulmonary/Chest: Effort normal and breath sounds without rales or wheezing  Abdominal: Soft. Bowel sounds are normal. NT. No HSM  Musculoskeletal: Normal range of motion. Exhibits no edema.  Lymphadenopathy:  Has no cervical adenopathy.  Neurological: Pt is alert and oriented to person, place, and time. Pt has normal reflexes. No cranial nerve deficit. Motor grossly intact Skin: Skin is warm and dry. No rash noted.  Psychiatric:  Has normal mood and affect. Behavior is normal.  Feet with calloused heels and balls of feet as well , no ulcers     Assessment & Plan:

## 2015-02-17 ENCOUNTER — Other Ambulatory Visit: Payer: Self-pay | Admitting: Internal Medicine

## 2015-02-17 LAB — HEPATITIS C ANTIBODY: HCV AB: NEGATIVE

## 2015-02-17 LAB — LYME AB/WESTERN BLOT REFLEX: B BURGDORFERI AB IGG+ IGM: 0.08 {ISR}

## 2015-02-17 MED ORDER — ROSUVASTATIN CALCIUM 40 MG PO TABS: 40.0000 mg | ORAL_TABLET | Freq: Every day | ORAL | Status: DC

## 2015-03-13 ENCOUNTER — Ambulatory Visit (INDEPENDENT_AMBULATORY_CARE_PROVIDER_SITE_OTHER): Payer: Commercial Managed Care - PPO | Admitting: Sports Medicine

## 2015-03-13 ENCOUNTER — Encounter: Payer: Self-pay | Admitting: Sports Medicine

## 2015-03-13 ENCOUNTER — Ambulatory Visit (INDEPENDENT_AMBULATORY_CARE_PROVIDER_SITE_OTHER): Payer: Commercial Managed Care - PPO

## 2015-03-13 VITALS — BP 165/80 | HR 82 | Resp 17

## 2015-03-13 DIAGNOSIS — L603 Nail dystrophy: Secondary | ICD-10-CM | POA: Diagnosis not present

## 2015-03-13 DIAGNOSIS — M779 Enthesopathy, unspecified: Secondary | ICD-10-CM | POA: Diagnosis not present

## 2015-03-13 DIAGNOSIS — M79673 Pain in unspecified foot: Secondary | ICD-10-CM | POA: Diagnosis not present

## 2015-03-13 DIAGNOSIS — L853 Xerosis cutis: Secondary | ICD-10-CM | POA: Diagnosis not present

## 2015-03-13 NOTE — Progress Notes (Signed)
Patient ID: Stacy Cain, female   DOB: 1945/05/14, 70 y.o.   MRN: [id number] Subjective: Stacy Cain is a 70 y.o. diabetic female patient who presents to office for evaluation of Right> Left foot pain. 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 Patient also concerned about right big toenail reports that her dog stepped on it several months ago; noticed bruising to the nail which is getting better but wanted it to be checked. Patient also states that she has pain occasionally at right ankle and has noticed it off and on for about 6 months achy in nature worse with walking better with rest and sneakers. Patient denies any other pedal complaints. Denies injury/trip/fall/sprain/any causative factors.   FBS patient reported 200, A1c 7.7  Patient Active Problem List   Diagnosis Date Noted  . Tick bite 02/16/2015  . Pre-ulcerative corn or callous 02/16/2015  . AKI (acute kidney injury) ([place]) 06/08/2014  . Type I diabetes mellitus, uncontrolled ([place]) 12/17/2013  . Essential hypertension, benign 12/17/2013  . Hyperlipidemia 12/17/2013  . 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 Acute bursitis of left shoulder 08/27/2013  . Carotid bruit 08/16/2013  . Cervical radiculitis 10/17/2010  . Preventative health care 10/17/2010  . CAROTID BRUIT 07/28/2009  . HAIR LOSS 02/17/2009  . GERD 12/23/2007  . BACK PAIN 12/23/2007  . ANXIETY 10/28/2007  . SKIN LESION 10/27/2007  . CORONARY ARTERY DISEASE 07/22/2007   Current Outpatient Prescriptions on File Prior to Visit  Medication Sig Dispense Refill  . Blood Glucose Monitoring Suppl (ONE TOUCH ULTRA 2) w/Device KIT Use to check blood sugar 7 times per day E10.65 1 each 0  . calcipotriene-betamethasone (TACLONEX) ointment Apply topically 2 (two) times daily.    Marland Kitchen HUMALOG 100 UNIT/ML injection INJECT 60 UNITS UNDER THE SKIN DAILY 20 mL 3  .  levothyroxine (SYNTHROID, LEVOTHROID) 88 MCG tablet TAKE 1 TABLET BY MOUTH DAILY BEFORE BREAKFAST 30 tablet 3  . ONE TOUCH ULTRA TEST test strip Use to check blood sugar 7  times per day 200 each 5  . rosuvastatin (CRESTOR) 40 MG tablet Take 1 tablet (40 mg total) by mouth daily. 90 tablet 3  . urea (CARMOL) 40 % CREA Apply topically 2 (two) times daily.     No current facility-administered medications on file prior to visit.   Allergies  Allergen Reactions  . Atorvastatin     REACTION: joint pains  . Influenza Vaccines     Patient got bursitis after her vaccine  . Losartan Potassium     REACTION: blurred vision  . Metoprolol Succinate     REACTION: trigger fingers, arm pain  . Olmesartan Medoxomil   . Quinapril Hcl     REACTION: throat swelling    Objective:  General: Alert and oriented x3 in no acute distress  Dermatology: Mild dry skin bilateral heels with no opening or fissuring, No open lesions bilateral lower extremities, no webspace macerations, no ecchymosis bilateral, all nails x 10 are well manicured. Right hallux toenail mild distal onycholysis with no signs of infection.  Vascular: Dorsalis Pedis 2/4 and Posterior Tibial pedal pulses 1/4, Capillary Fill Time 3 seconds,(+) pedal hair growth bilateral, no edema bilateral lower extremities, Temperature gradient within normal limits.  Neurology: Johney Maine sensation intact via light touch bilateral, Protective sensation intact  with Thornell Mule Monofilament to all pedal sites,Vibratory slightly diminished bilateral. No babinski sign present bilateral. (- )Tinels sign.  Musculoskeletal: No pain with palpation to right hallux nail, bilateral heels, or right medial ankle; subjective ocassional pain over the course of the PT tendon with collapsing pronated foot type and functional limitus noted bilateral.Strength within normal limits in all groups bilateral.   Xrays  Right and Left foot: Normal osseous mineralization,  Posterior and inferior calcaneal spur, 1st ray elevatus with squaring of metatarsal head, lesser hammertoe deformity, mild midtarsal breach and decreased in calcaneal inclination angle. No fracture/dislocation. Soft tissues within normal limits. No foreign body.  Assessment and Plan: Problem List Items Addressed This Visit    None    Visit Diagnoses    Foot pain, unspecified laterality    -  Primary    Relevant Orders    DG Foot 2 Views Left    DG Foot 2 Views Right    Tendonitis        PT tendon on right    Nail dystrophy        Right hallux dog stepped on toe    Dry skin        Bilateral heels       -Complete examination performed -Xrays reviewed -Discussed treatement options -Recommend skin emollients/Bag Balm for dry skin with silicone socks or under oculsion  -Recommend good supportive shoes and inserts; Referral to [facility] [employer] given -Dispensed fascial brace to use daily in the meantime on right foot for occassional pain at PT -Recommend ice therapy daily  -No anti-inflammatories due to GI/GERD history -May consider steroid injection if pain at Right PT becomes more constant -Recommend close monitoring of Right hallux nail; at this time well attached proximally and no need for debridement or nail procedure -Patient to return to office in 3 weeks or sooner if condition worsens.  Landis Martins, DPM

## 2015-03-13 NOTE — Patient Instructions (Signed)
Bag Balm and silicone socks for dry skin and heels  Ice pack 2x daily  Wear brace on right foot while in shoes

## 2015-04-03 ENCOUNTER — Encounter: Payer: Self-pay | Admitting: Sports Medicine

## 2015-04-03 ENCOUNTER — Ambulatory Visit (INDEPENDENT_AMBULATORY_CARE_PROVIDER_SITE_OTHER): Payer: Commercial Managed Care - PPO | Admitting: Sports Medicine

## 2015-04-03 DIAGNOSIS — L853 Xerosis cutis: Secondary | ICD-10-CM

## 2015-04-03 DIAGNOSIS — M779 Enthesopathy, unspecified: Secondary | ICD-10-CM | POA: Diagnosis not present

## 2015-04-03 DIAGNOSIS — L603 Nail dystrophy: Secondary | ICD-10-CM

## 2015-04-03 DIAGNOSIS — M79673 Pain in unspecified foot: Secondary | ICD-10-CM

## 2015-04-03 NOTE — Progress Notes (Signed)
Patient ID: Keatyn Luck, female   DOB: Feb 26, 1945, 70 y.o.   MRN: [id number]  Subjective: Shanen Norris is a 70 y.o. diabetic female patient who returns to office for evaluation of Right> Left foot pain. Patient states that pain is much better; almost completely resolved, admits to ocassional burning pain in right foot but no where near like it was before; states that her new sneakers and inserts have helped her pain tremendously. Patient also states that her right big toenail is doing ok; has been keeping it filed so she wont snag it. Patient denies any other pedal complaints.   FBS patient reported 180  Patient Active Problem List   Diagnosis Date Noted  . Tick bite 02/16/2015  . Pre-ulcerative corn or callous 02/16/2015  . AKI (acute kidney injury) ([place]) 06/08/2014  . Type I diabetes mellitus, uncontrolled ([place]) 12/17/2013  . Essential hypertension, benign 12/17/2013  . Hyperlipidemia 12/17/2013  . 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 Acute bursitis of left shoulder 08/27/2013  . Carotid bruit 08/16/2013  . Cervical radiculitis 10/17/2010  . Preventative health care 10/17/2010  . CAROTID BRUIT 07/28/2009  . HAIR LOSS 02/17/2009  . GERD 12/23/2007  . BACK PAIN 12/23/2007  . ANXIETY 10/28/2007  . SKIN LESION 10/27/2007  . CORONARY ARTERY DISEASE 07/22/2007   Current Outpatient Prescriptions on File Prior to Visit  Medication Sig Dispense Refill  . Blood Glucose Monitoring Suppl (ONE TOUCH ULTRA 2) w/Device KIT Use to check blood sugar 7 times per day E10.65 1 each 0  . calcipotriene-betamethasone (TACLONEX) ointment Apply topically 2 (two) times daily.    Marland Kitchen HUMALOG 100 UNIT/ML injection INJECT 60 UNITS UNDER THE SKIN DAILY 20 mL 3  . levothyroxine (SYNTHROID, LEVOTHROID) 88 MCG tablet TAKE 1 TABLET BY MOUTH DAILY BEFORE BREAKFAST 30 tablet 3  . ONE TOUCH ULTRA TEST test strip Use to check blood sugar 7  times per day 200 each 5  . rosuvastatin (CRESTOR) 40 MG tablet Take 1  tablet (40 mg total) by mouth daily. 90 tablet 3  . urea (CARMOL) 40 % CREA Apply topically 2 (two) times daily.     No current facility-administered medications on file prior to visit.   Allergies  Allergen Reactions  . Atorvastatin     REACTION: joint pains  . Influenza Vaccines     Patient got bursitis after her vaccine  . Losartan Potassium     REACTION: blurred vision  . Metoprolol Succinate     REACTION: trigger fingers, arm pain  . Olmesartan Medoxomil   . Quinapril Hcl     REACTION: throat swelling    Objective:  General: Alert and oriented x3 in no acute distress  Dermatology: Improved dry skin bilateral heels with no opening or fissuring, No open lesions bilateral lower extremities, no webspace macerations, no ecchymosis bilateral, all nails x 10 are well manicured. Right hallux toenail mild distal onycholysis with no signs of infection.  Vascular: Dorsalis Pedis 2/4 and Posterior Tibial pedal pulses 1/4, Capillary Fill Time 3 seconds,(+) pedal hair growth bilateral, no edema bilateral lower extremities, Temperature gradient within normal limits.  Neurology: Johney Maine sensation intact via light touch bilateral, Protective sensation intact  with Thornell Mule Monofilament to all pedal sites,Vibratory slightly diminished bilateral. No babinski sign present bilateral. (- )Tinels sign.   Musculoskeletal: No pain with palpation to right hallux nail, bilateral heels, or right medial ankle; subjective ocassional pain over the course of the PT tendon with collapsing pronated foot type and functional limitus noted  bilateral.Strength within normal limits in all groups bilateral.   Assessment and Plan: Problem List Items Addressed This Visit    None    Visit Diagnoses    Foot pain, unspecified laterality    -  Primary    Tendonitis        Right PT    Nail dystrophy        Dry skin           -Complete examination performed -Discussed long term plan of care -Recommend cont  with skin emollients/Bag Balm for dry skin with silicone socks or under oculsion  -Recommend cont with good supportive shoes and inserts -May discontinue fascial brace on right since pain is controlled with shoes and inserts from [employer] -Recommend ice therapy daily  As needed -No anti-inflammatories due to GI/GERD history -May consider steroid injection if pain at Right PT becomes more constant if future -Recommend close monitoring of Right hallux nail; at this time well attached proximally and no need for debridement or nail procedure -Patient to return to office as needed or sooner if condition worsens.  Landis Martins, DPM

## 2015-04-18 ENCOUNTER — Other Ambulatory Visit: Payer: Self-pay | Admitting: Endocrinology

## 2015-05-05 ENCOUNTER — Other Ambulatory Visit: Payer: Self-pay | Admitting: *Deleted

## 2015-05-05 MED ORDER — GLUCOSE BLOOD VI STRP: ORAL_STRIP | Status: DC

## 2015-05-08 ENCOUNTER — Other Ambulatory Visit (INDEPENDENT_AMBULATORY_CARE_PROVIDER_SITE_OTHER): Payer: Commercial Managed Care - PPO

## 2015-05-08 DIAGNOSIS — E038 Other specified hypothyroidism: Secondary | ICD-10-CM

## 2015-05-08 DIAGNOSIS — E1065 Type 1 diabetes mellitus with hyperglycemia: Secondary | ICD-10-CM

## 2015-05-08 DIAGNOSIS — E063 Autoimmune thyroiditis: Secondary | ICD-10-CM

## 2015-05-08 LAB — COMPREHENSIVE METABOLIC PANEL
ALBUMIN: 4.1 g/dL (ref 3.5–5.2)
ALK PHOS: 82 U/L (ref 39–117)
ALT: 11 U/L (ref 0–35)
AST: 16 U/L (ref 0–37)
BUN: 15 mg/dL (ref 6–23)
CO2: 29 mEq/L (ref 19–32)
CREATININE: 0.92 mg/dL (ref 0.40–1.20)
Calcium: 9.7 mg/dL (ref 8.4–10.5)
Chloride: 101 mEq/L (ref 96–112)
GFR: 64.24 mL/min (ref >60.00)
GLUCOSE: 123 mg/dL — AB (ref 70–99)
POTASSIUM: 4.7 meq/L (ref 3.5–5.1)
SODIUM: 136 meq/L (ref 135–145)
TOTAL PROTEIN: 7.3 g/dL (ref 6.0–8.3)
Total Bilirubin: 0.5 mg/dL (ref 0.2–1.2)

## 2015-05-08 LAB — LIPID PANEL
Cholesterol: 236 mg/dL — ABNORMAL HIGH (ref 0–200)
HDL: 64.7 mg/dL (ref >39.00)
LDL CALC: 154 mg/dL — AB (ref 0–99)
NONHDL: 171.11
Total CHOL/HDL Ratio: 4
Triglycerides: 86 mg/dL (ref 0.0–149.0)
VLDL: 17.2 mg/dL (ref 0.0–40.0)

## 2015-05-08 LAB — T4, FREE: Free T4: 0.89 ng/dL (ref 0.60–1.60)

## 2015-05-08 LAB — TSH: TSH: 7.25 u[IU]/mL — ABNORMAL HIGH (ref 0.35–4.50)

## 2015-05-08 LAB — MICROALBUMIN / CREATININE URINE RATIO
CREATININE, U: 310.9 mg/dL
MICROALB UR: 2.3 mg/dL — AB (ref 0.0–1.9)
Microalb Creat Ratio: 0.7 mg/g (ref 0.0–30.0)

## 2015-05-08 LAB — HEMOGLOBIN A1C: HEMOGLOBIN A1C: 7.8 % — AB (ref 4.6–6.5)

## 2015-05-11 ENCOUNTER — Encounter: Payer: Self-pay | Admitting: Endocrinology

## 2015-05-11 ENCOUNTER — Ambulatory Visit (INDEPENDENT_AMBULATORY_CARE_PROVIDER_SITE_OTHER): Payer: Commercial Managed Care - PPO | Admitting: Endocrinology

## 2015-05-11 VITALS — BP 145/84 | HR 94 | Temp 97.7°F | Resp 14 | Ht 61.0 in | Wt 167.4 lb

## 2015-05-11 DIAGNOSIS — E038 Other specified hypothyroidism: Secondary | ICD-10-CM

## 2015-05-11 DIAGNOSIS — E063 Autoimmune thyroiditis: Secondary | ICD-10-CM

## 2015-05-11 DIAGNOSIS — E785 Hyperlipidemia, unspecified: Secondary | ICD-10-CM

## 2015-05-11 DIAGNOSIS — E1065 Type 1 diabetes mellitus with hyperglycemia: Secondary | ICD-10-CM | POA: Diagnosis not present

## 2015-05-11 MED ORDER — SIMVASTATIN 40 MG PO TABS: 40.0000 mg | ORAL_TABLET | Freq: Every day | ORAL | Status: DC

## 2015-05-11 MED ORDER — LEVOTHYROXINE SODIUM 100 MCG PO TABS: 100.0000 ug | ORAL_TABLET | Freq: Every day | ORAL | Status: DC

## 2015-05-11 NOTE — Progress Notes (Signed)
Patient ID: Stacy Cain, female   DOB: 02-10-1945, 70 y.o.   MRN: [id number]   Reason for Appointment: Insulin Pump followup:   History of Present Illness   Diagnosis: Type 1 DIABETES MELITUS, date of diagnosis:  1974  DIABETES history: She has been on an insulin pump for several years with variable control. She is fairly compliant with monitoring her blood sugars and doing some adjustments of her pump settings but continues to have variability in blood sugar  Her A1c has previously been stable between 7-7.4%  CURRENT insulin pump:  Medtronic   RECENT HISTORY:   PUMP SETTINGS are: Basal rate: Midnight = 0.6; 7 AM = 1.5,  3 PM = 1.0, 6 PM = 1.05, 10 PM = 0.8 Carb Ratio: 1: 12, 1:10 at 5 PM  Correction factor 1: 50 at 6 AM and 45 at 12 noon; 55 overnight, target 110, active insulin 4 hours  She was advised to increase her midnight basal rate on the last visit but she did not do so.  Her A1c has staying around 7.8-7.9 She is not entering her blood sugars in the pump consistently and is not able to get the Contour test strips from her insurance despite prior authorization being done  Current blood sugar patterns and problems:   She has significant variability in her blood sugars with no consistent pattern  She is checking her blood sugars mostly fasting and late evening  Overall most significant trend his higher readings fasting  He is eating very small meals but very frequently also and usually not checking while bolusing during the day, only sometimes in the evenings at suppertime.  She has an average of 9.1 boluses daily   She tends to have mostly high readings at bedtime but not consistently  Blood sugars are relatively higher around suppertime but not consistently also  She does not test as much at mealtimes before eating as her meter is not in her kitchen  She gets low blood sugars when she is shopping and sometimes forgets to do the temporary basal  rate  GLUCOSE CONTROL with the pump is assessed today by meter download. Checking blood sugar about 3 times a day on an average  Mean values apply above for all meters except median for One Touch  PRE-MEAL Fasting Lunch Dinner Bedtime Overall  Glucose range: 88-276  85  83-273  76-258    Mean/median: 150    170  163+/-65      EXERCISE:She is not doing any exercise even though she has a treadmill at home.   She is mostly walking at shopping locations   Wt Readings from Last 3 Encounters:  05/11/15 167 lb 6.4 oz (75.932 kg)  02/16/15 165 lb (74.844 kg)  02/10/15 165 lb 3.2 oz (74.934 kg)     Lab Results  Component Value Date   HGBA1C 7.8* 05/08/2015   HGBA1C 7.9* 02/07/2015   HGBA1C 7.8* 11/07/2014   Lab Results  Component Value Date   MICROALBUR 2.3* 05/08/2015   LDLCALC 154* 05/08/2015   CREATININE 0.92 05/08/2015     Lab on 05/08/2015  Component Date Value Ref Range Status  . Hgb A1c MFr Bld 05/08/2015 7.8* 4.6 - 6.5 % Final   Glycemic Control Guidelines for People with Diabetes:Non Diabetic:  <6%Goal of Therapy: <7%Additional Action Suggested:  >8%   . Sodium 05/08/2015 136  135 - 145 mEq/L Final  . Potassium 05/08/2015 4.7  3.5 - 5.1 mEq/L Final  . Chloride  05/08/2015 101  96 - 112 mEq/L Final  . CO2 05/08/2015 29  19 - 32 mEq/L Final  . Glucose, Bld 05/08/2015 123* 70 - 99 mg/dL Final  . BUN 05/08/2015 15  6 - 23 mg/dL Final  . Creatinine, Ser 05/08/2015 0.92  0.40 - 1.20 mg/dL Final  . Total Bilirubin 05/08/2015 0.5  0.2 - 1.2 mg/dL Final  . Alkaline Phosphatase 05/08/2015 82  39 - 117 U/L Final  . AST 05/08/2015 16  0 - 37 U/L Final  . ALT 05/08/2015 11  0 - 35 U/L Final  . Total Protein 05/08/2015 7.3  6.0 - 8.3 g/dL Final  . Albumin 05/08/2015 4.1  3.5 - 5.2 g/dL Final  . Calcium 05/08/2015 9.7  8.4 - 10.5 mg/dL Final  . GFR 05/08/2015 64.24  >60.00 mL/min Final  . Cholesterol 05/08/2015 236* 0 - 200 mg/dL Final   ATP III Classification        Desirable:  < 200 mg/dL               Borderline High:  200 - 239 mg/dL          High:  > = 240 mg/dL  . Triglycerides 05/08/2015 86.0  0.0 - 149.0 mg/dL Final   Normal:  <150 mg/dLBorderline High:  150 - 199 mg/dL  . HDL 05/08/2015 64.70  >39.00 mg/dL Final  . VLDL 05/08/2015 17.2  0.0 - 40.0 mg/dL Final  . LDL Cholesterol 05/08/2015 154* 0 - 99 mg/dL Final  . Total CHOL/HDL Ratio 05/08/2015 4   Final                  Men          Women1/2 Average Risk     3.4          3.3Average Risk          5.0          4.42X Average Risk          9.6          7.13X Average Risk          15.0          11.0                      . NonHDL 05/08/2015 171.11   Final   NOTE:  Non-HDL goal should be 30 mg/dL higher than patient's LDL goal (i.e. LDL goal of < 70 mg/dL, would have non-HDL goal of < 100 mg/dL)  . Microalb, Ur 05/08/2015 2.3* 0.0 - 1.9 mg/dL Final  . Creatinine,U 05/08/2015 310.9   Final  . Microalb Creat Ratio 05/08/2015 0.7  0.0 - 30.0 mg/g Final  . Free T4 05/08/2015 0.89  0.60 - 1.60 ng/dL Final  . TSH 05/08/2015 7.25* 0.35 - 4.50 uIU/mL Final      Medication List       This list is accurate as of: 05/11/15  1:32 PM.  Always use your most recent med list.               glucose blood test strip  Commonly known as:  ONE TOUCH ULTRA TEST  Use to check blood sugar 7  times per day Dx code E10.65     HUMALOG 100 UNIT/ML injection  Generic drug:  insulin lispro  INJECT 60 UNITS UNDER THE SKIN DAILY     levothyroxine 100 MCG tablet  Commonly known as:  SYNTHROID, LEVOTHROID  Take 1 tablet (100 mcg total) by mouth daily.     ONE TOUCH ULTRA 2 w/Device Kit  Use to check blood sugar 7 times per day E10.65     simvastatin 40 MG tablet  Commonly known as:  ZOCOR  Take 1 tablet (40 mg total) by mouth at bedtime.        Allergies:  Allergies  Allergen Reactions  . Atorvastatin     REACTION: joint pains  . Influenza Vaccines     Patient got bursitis after her vaccine  . Losartan  Potassium     REACTION: blurred vision  . Metoprolol Succinate     REACTION: trigger fingers, arm pain  . Olmesartan Medoxomil   . Quinapril Hcl     REACTION: throat swelling    Past Medical History  Diagnosis Date  . ANXIETY 10/28/2007  . BACK PAIN 12/23/2007  . CAROTID BRUIT 07/28/2009  . CORONARY ARTERY DISEASE 07/22/2007  . DIABETES MELLITUS, TYPE I 07/22/2007  . GERD 12/23/2007  . HAIR LOSS 02/17/2009  . HYPERLIPIDEMIA 07/22/2007  . HYPERTENSION 07/22/2007  . HYPOTHYROIDISM 07/22/2007  . SKIN LESION 10/27/2007  . URI 12/23/2007    Past Surgical History  Procedure Laterality Date  . S/p coronary stent    . Coronary artery bypass graft    . Cesarean section      x2  . Tubal ligation      Family History  Problem Relation Age of Onset  . Hypertension Mother   . Melanoma Mother   . Alcohol abuse Sister   . Arthritis Maternal Grandmother     Social History:  reports that she has never smoked. She does not have any smokeless tobacco history on file. She reports that she does not drink alcohol or use illicit drugs.  REVIEW of systems:  Eye exam last  in 6/16 , has retinopathy, Stable.  Has not needed laser and quite some time  HYPOTHYROIDISM: She has had long-standing primary hypothyroidism and previously has been on either Armour Thyroid or levothyroxine. She thinks she did not tolerate Armour Thyroid.  She has been taking 88 mcg regularly qd, Now complaining of some fatigue  With taking her medication in the morning she would tend to forget and is taking this at night and is consistent with this  TSH is increased   Lab Results  Component Value Date   TSH 7.25* 05/08/2015   TSH 4.38 02/16/2015   TSH 1.27 11/07/2014   FREET4 0.89 05/08/2015   FREET4 1.26 11/07/2014   FREET4 0.71 04/27/2014     ?  Hypertension She has had borderline or high blood pressure readings but refuses to take any medication Previously she had agreed to take a small dose of Diovan  especially to prevent retinopathy and nephropathy but she did not start this No microalbuminuria Home BP is not been checked recently  Hyperlipidemia:  On several of her previous visits she has agreed to restart taking Zocor but still has not done this even though it has been explained to her why she needs to do this Her PCP gave her a prescription for 40 mg Crestor which she does not want to do this    Her lipids are not controlled.  She tends to have better lipids when she reduces her meat intake  Lab Results  Component Value Date   CHOL 236* 05/08/2015   HDL 64.70 05/08/2015   LDLCALC 154* 05/08/2015   LDLDIRECT 137.3 02/24/2013   TRIG 86.0 05/08/2015  CHOLHDL 4 05/08/2015     Diabetic foot exam 7/16: shows normal monofilament sensation in the toes and plantar surfaces, no skin lesions or ulcers on the feet and normal pedal pulses  EXAM:  BP 145/84 mmHg  Pulse 94  Temp(Src) 97.7 F (36.5 C)  Resp 14  Ht 5' 1"  (1.549 m)  Wt 167 lb 6.4 oz (75.932 kg)  BMI 31.65 kg/m2  SpO2 96%  Repeat blood pressure was 142/72   ASSESSMENT:  DIABETES type 1:  See history of present illness for detailed discussion of his current management, blood sugar patterns and problems identified Her A1c is now consistently around 7.8-7.9 Part of the difficulty with her control is variability in her blood sugars even overnight  Also she does not check blood sugars enough especially during the day Her diet is inconsistent and she is eating several small snacks throughout the day rather than a full meal Only rarely has issues with infusion set Blood sugars are overall relatively high fasting and will go up on her basal rate to 1.6 between 7 AM-9 AM is also at 3 PM up to 1.1 She thinks her sugars are better controlled with Novolog but not able to get this from her insurance  She will benefit from the continuous glucose monitoring with she wants to wait until she is ready to a breakup pump at the end  of the year. However she agrees to have the continuous glucose monitor sensor placed a couple of weeks before her next visit Reminded her to use a temporary basal rate for increased activity such as shopping She can also benefit from using a treadmill regularly at home  HYPOTHYROIDISM:  She will increase the dose to 100 g  Hyperlipidemia:  She agrees to take the simvastatin medication and come back in 6 weeks for follow-up However she refuses to consider 40 mg dose She will use half tablet  Counseling time on subjects discussed above is over 50% of today's 25 minute visit  Patient Instructions       Stacy Cain 05/11/2015, 1:32 PM

## 2015-05-15 ENCOUNTER — Encounter: Payer: Self-pay | Admitting: Endocrinology

## 2015-05-15 NOTE — Telephone Encounter (Signed)
Please advise.  Please explain the Tot CHOL to HDL ratio as it pertains to my result.

## 2015-05-29 ENCOUNTER — Other Ambulatory Visit: Payer: Self-pay | Admitting: Endocrinology

## 2015-06-12 ENCOUNTER — Encounter: Payer: Commercial Managed Care - PPO | Admitting: Nutrition

## 2015-06-12 ENCOUNTER — Encounter: Payer: Self-pay | Admitting: Endocrinology

## 2015-06-19 ENCOUNTER — Other Ambulatory Visit: Payer: Commercial Managed Care - PPO

## 2015-06-21 ENCOUNTER — Ambulatory Visit: Payer: Commercial Managed Care - PPO | Admitting: Endocrinology

## 2015-06-23 ENCOUNTER — Ambulatory Visit: Payer: Commercial Managed Care - PPO | Admitting: Endocrinology

## 2015-07-18 ENCOUNTER — Encounter: Payer: Self-pay | Admitting: Endocrinology

## 2015-07-21 ENCOUNTER — Other Ambulatory Visit: Payer: Commercial Managed Care - PPO

## 2015-07-25 ENCOUNTER — Other Ambulatory Visit: Payer: Self-pay | Admitting: Endocrinology

## 2015-07-26 ENCOUNTER — Ambulatory Visit: Payer: Commercial Managed Care - PPO | Admitting: Endocrinology

## 2015-08-16 ENCOUNTER — Other Ambulatory Visit (INDEPENDENT_AMBULATORY_CARE_PROVIDER_SITE_OTHER): Payer: Commercial Managed Care - PPO

## 2015-08-16 DIAGNOSIS — E1065 Type 1 diabetes mellitus with hyperglycemia: Secondary | ICD-10-CM | POA: Diagnosis not present

## 2015-08-16 LAB — COMPREHENSIVE METABOLIC PANEL
ALT: 11 U/L (ref 0–35)
AST: 15 U/L (ref 0–37)
Albumin: 4 g/dL (ref 3.5–5.2)
Alkaline Phosphatase: 94 U/L (ref 39–117)
BUN: 12 mg/dL (ref 6–23)
CALCIUM: 9.7 mg/dL (ref 8.4–10.5)
CHLORIDE: 104 meq/L (ref 96–112)
CO2: 28 meq/L (ref 19–32)
CREATININE: 0.81 mg/dL (ref 0.40–1.20)
GFR: 74.35 mL/min (ref >60.00)
GLUCOSE: 63 mg/dL — AB (ref 70–99)
Potassium: 4 mEq/L (ref 3.5–5.1)
Sodium: 139 mEq/L (ref 135–145)
Total Bilirubin: 0.4 mg/dL (ref 0.2–1.2)
Total Protein: 7.3 g/dL (ref 6.0–8.3)

## 2015-08-16 LAB — T4, FREE: Free T4: 0.96 ng/dL (ref 0.60–1.60)

## 2015-08-16 LAB — TSH: TSH: 3.31 u[IU]/mL (ref 0.35–4.50)

## 2015-08-16 LAB — LIPID PANEL
CHOL/HDL RATIO: 3
Cholesterol: 232 mg/dL — ABNORMAL HIGH (ref 0–200)
HDL: 73.2 mg/dL (ref >39.00)
LDL CALC: 142 mg/dL — AB (ref 0–99)
NONHDL: 159.2
Triglycerides: 85 mg/dL (ref 0.0–149.0)
VLDL: 17 mg/dL (ref 0.0–40.0)

## 2015-08-21 ENCOUNTER — Ambulatory Visit (INDEPENDENT_AMBULATORY_CARE_PROVIDER_SITE_OTHER): Payer: Commercial Managed Care - PPO | Admitting: Endocrinology

## 2015-08-21 ENCOUNTER — Encounter: Payer: Self-pay | Admitting: Endocrinology

## 2015-08-21 VITALS — BP 122/74 | HR 80 | Wt 163.0 lb

## 2015-08-21 DIAGNOSIS — E785 Hyperlipidemia, unspecified: Secondary | ICD-10-CM | POA: Diagnosis not present

## 2015-08-21 DIAGNOSIS — E1065 Type 1 diabetes mellitus with hyperglycemia: Secondary | ICD-10-CM

## 2015-08-21 LAB — POCT GLYCOSYLATED HEMOGLOBIN (HGB A1C): Hemoglobin A1C: 6.9

## 2015-08-21 MED ORDER — SIMVASTATIN 40 MG PO TABS: 40.0000 mg | ORAL_TABLET | Freq: Every day | ORAL | Status: DC

## 2015-08-21 NOTE — Progress Notes (Signed)
Patient ID: Stacy Cain, female   DOB: 09/02/1945, 70 y.o.   MRN: [id number]   Reason for Appointment:  followup:   History of Present Illness   Diagnosis: Type 1 DIABETES MELITUS, date of diagnosis:  1974  DIABETES history: She has been on an insulin pump for several years with variable control. She is fairly compliant with monitoring her blood sugars and doing some adjustments of her pump settings but continues to have variability in blood sugar  Her A1c has previously been stable between 7-7.4%  CURRENT insulin pump:  Medtronic   RECENT HISTORY:   PUMP SETTINGS are: Basal rate: Midnight = 0.6; 7 AM = 1.5,  3 PM = 1.0, 6 PM = 1.05, 10 PM = 0.8 Carb Ratio: 1: 12, 1:10 at 5 PM  Correction factor 1: 50 at 6 AM and 45 at 12 noon; 55 overnight, target 110, active insulin 4 hours  Her A1c has previously been staying around 7.8-7.9, now improved at 6.9 She is not entering her blood sugars in the pump but her One Touch monitor was downloaded today  Current blood sugar patterns and problems:   She has significant variability in her blood sugars again  She is having relatively higher fasting readings overall  Blood sugars are generally better while lunch and usually by suppertime  Does not always check blood sugars after meals  She may occasionally feel hypoglycemic in the afternoon and has a few readings in the 60s.  Her blood sugars are lower when she is walking in the afternoon and she does not always remember to do a temporary basal  Postprandial blood sugars after her evening meal are close to normal or mildly low  She may have relatively low sugars in the early hours of the night also  She tends to have recurrent snacks and is bolusing at least 7 times a day for meals or carbohydrate intake  Weight is slightly better  GLUCOSE CONTROL with the pump is assessed today by meter download. Checking blood sugar about 3 times a day on an average  Mean values apply above  for all meters except median for One Touch  PRE-MEAL Fasting Lunch Dinner Bedtime Overall  Glucose range: 66-264  65-206  61-268  58-306    Mean/median: 164   116  130  142+/-68    POST-MEAL PC Breakfast PC Lunch Overnight   Glucose range:   53-447   Mean/median:   168      EXERCISE:She is not doing any exercise even though she has a treadmill at home.   She is mostly walking For shopping   Wt Readings from Last 3 Encounters:  08/21/15 163 lb (73.936 kg)  05/11/15 167 lb 6.4 oz (75.932 kg)  02/16/15 165 lb (74.844 kg)     Lab Results  Component Value Date   HGBA1C 7.8* 05/08/2015   HGBA1C 7.9* 02/07/2015   HGBA1C 7.8* 11/07/2014   Lab Results  Component Value Date   MICROALBUR 2.3* 05/08/2015   LDLCALC 142* 08/16/2015   CREATININE 0.81 08/16/2015     Lab on 08/16/2015  Component Date Value Ref Range Status  . TSH 08/16/2015 3.31  0.35 - 4.50 uIU/mL Final  . Free T4 08/16/2015 0.96  0.60 - 1.60 ng/dL Final  . Cholesterol 08/16/2015 232* 0 - 200 mg/dL Final   ATP III Classification       Desirable:  < 200 mg/dL  Borderline High:  200 - 239 mg/dL          High:  > = 240 mg/dL  . Triglycerides 08/16/2015 85.0  0.0 - 149.0 mg/dL Final   Normal:  <150 mg/dLBorderline High:  150 - 199 mg/dL  . HDL 08/16/2015 73.20  >39.00 mg/dL Final  . VLDL 08/16/2015 17.0  0.0 - 40.0 mg/dL Final  . LDL Cholesterol 08/16/2015 142* 0 - 99 mg/dL Final  . Total CHOL/HDL Ratio 08/16/2015 3   Final                  Men          Women1/2 Average Risk     3.4          3.3Average Risk          5.0          4.42X Average Risk          9.6          7.13X Average Risk          15.0          11.0                      . NonHDL 08/16/2015 159.20   Final   NOTE:  Non-HDL goal should be 30 mg/dL higher than patient's LDL goal (i.e. LDL goal of < 70 mg/dL, would have non-HDL goal of < 100 mg/dL)  . Sodium 08/16/2015 139  135 - 145 mEq/L Final  . Potassium 08/16/2015 4.0  3.5 - 5.1 mEq/L  Final  . Chloride 08/16/2015 104  96 - 112 mEq/L Final  . CO2 08/16/2015 28  19 - 32 mEq/L Final  . Glucose, Bld 08/16/2015 63* 70 - 99 mg/dL Final  . BUN 08/16/2015 12  6 - 23 mg/dL Final  . Creatinine, Ser 08/16/2015 0.81  0.40 - 1.20 mg/dL Final  . Total Bilirubin 08/16/2015 0.4  0.2 - 1.2 mg/dL Final  . Alkaline Phosphatase 08/16/2015 94  39 - 117 U/L Final  . AST 08/16/2015 15  0 - 37 U/L Final  . ALT 08/16/2015 11  0 - 35 U/L Final  . Total Protein 08/16/2015 7.3  6.0 - 8.3 g/dL Final  . Albumin 08/16/2015 4.0  3.5 - 5.2 g/dL Final  . Calcium 08/16/2015 9.7  8.4 - 10.5 mg/dL Final  . GFR 08/16/2015 74.35  >60.00 mL/min Final      Medication List       This list is accurate as of: 08/21/15  8:41 AM.  Always use your most recent med list.               glucose blood test strip  Commonly known as:  ONE TOUCH ULTRA TEST  Use to check blood sugar 7  times per day Dx code E10.65     HUMALOG 100 UNIT/ML injection  Generic drug:  insulin lispro  INJECT 60 UNITS UNDER THE SKIN DAILY     HUMALOG 100 UNIT/ML injection  Generic drug:  insulin lispro  INJECT 60 UNITS UNDER THE SKIN DAILY     levothyroxine 100 MCG tablet  Commonly known as:  SYNTHROID, LEVOTHROID  TAKE 1 TABLET(100 MCG) BY MOUTH DAILY     ONE TOUCH ULTRA 2 w/Device Kit  Use to check blood sugar 7 times per day E10.65     simvastatin 40 MG tablet  Commonly known as:  ZOCOR  Take 1 tablet (  40 mg total) by mouth at bedtime.        Allergies:  Allergies  Allergen Reactions  . Atorvastatin     REACTION: joint pains  . Influenza Vaccines     Patient got bursitis after her vaccine  . Losartan Potassium     REACTION: blurred vision  . Metoprolol Succinate     REACTION: trigger fingers, arm pain  . Olmesartan Medoxomil   . Quinapril Hcl     REACTION: throat swelling    Past Medical History  Diagnosis Date  . ANXIETY 10/28/2007  . BACK PAIN 12/23/2007  . CAROTID BRUIT 07/28/2009  . CORONARY ARTERY  DISEASE 07/22/2007  . DIABETES MELLITUS, TYPE I 07/22/2007  . GERD 12/23/2007  . HAIR LOSS 02/17/2009  . HYPERLIPIDEMIA 07/22/2007  . HYPERTENSION 07/22/2007  . HYPOTHYROIDISM 07/22/2007  . SKIN LESION 10/27/2007  . URI 12/23/2007    Past Surgical History  Procedure Laterality Date  . S/p coronary stent    . Coronary artery bypass graft    . Cesarean section      x2  . Tubal ligation      Family History  Problem Relation Age of Onset  . Hypertension Mother   . Melanoma Mother   . Alcohol abuse Sister   . Arthritis Maternal Grandmother   . Diabetes Maternal Grandmother   . Heart disease Neg Hx     Social History:  reports that she has never smoked. She does not have any smokeless tobacco history on file. She reports that she does not drink alcohol or use illicit drugs.  REVIEW of systems:  Eye exam last  in 6/16 , has retinopathy, Stable.  Has not needed laser and quite some time  HYPOTHYROIDISM: She has had long-standing primary hypothyroidism and previously has been on either Armour Thyroid or levothyroxine. She thinks she did not tolerate Armour Thyroid.  She has been taking 100 g now, previously 88 mcg   With her increasing the dose in April her energy level is somewhat better  With taking her medication in the morning she would tend to forget and is taking this at night and is consistent with this  TSH is  normal again   Lab Results  Component Value Date   TSH 3.31 08/16/2015   TSH 7.25* 05/08/2015   TSH 4.38 02/16/2015   FREET4 0.96 08/16/2015   FREET4 0.89 05/08/2015   FREET4 1.26 11/07/2014     ?  Hypertension She has had borderline or high blood pressure readings but refuses to take any medication Previously she had agreed to take a small dose of Diovan especially to prevent retinopathy and nephropathy but she did not start this No microalbuminuria Home BP is not been checked   Hyperlipidemia:  On several of her previous visits she has agreed to  restart taking Zocor but still has not done this even though it has been explained to her why she needs to do this Again discussed today the importance of preventing any recurrent coronary events which she does not want to take more pills.  She threw away her previous prescription and wants another one but she does not commit to starting the medication again    Her lipids are not controlled.     Lab Results  Component Value Date   CHOL 232* 08/16/2015   HDL 73.20 08/16/2015   LDLCALC 142* 08/16/2015   LDLDIRECT 137.3 02/24/2013   TRIG 85.0 08/16/2015   CHOLHDL 3 08/16/2015  Diabetic foot exam 7/16: shows normal monofilament sensation in the toes and plantar surfaces, no skin lesions or ulcers on the feet and normal pedal pulses  EXAM:  BP 122/74 mmHg  Pulse 80  Wt 163 lb (73.936 kg)  SpO2 95%    ASSESSMENT:  DIABETES type 1:  See history of present illness for detailed discussion of his current management, blood sugar patterns and problems identified Her A1c is now improved Significantly  As discussed above her blood sugars are relatively higher fasting, fairly good at lunch and supper time but sporadically high overnight She is had more a tendency to low normal or low sugars before suppertime and early part of the night  Mealtime coverage appears to be fairly adequate but she has variability in her blood sugars especially overnight She is still not eligible to get the new pump with the conditions glucose monitoring  Will try to adjust basal rates to help keep her blood sugar more consistent as follows: Midnight = 0.5, 3 AM = 0.6, 6 AM = 1.6, 11 AM = 1.5, 3 PM = 1.1, 6 PM = 1.0 and 10 PM = 0.7  HYPOTHYROIDISM:  She will continue the dose to 100 g as TSH is back to normal  Hyperlipidemia:  She agrees to take the simvastatin medication and may try half tablet for now   Counseling time on subjects discussed above is over 50% of today's 25 minute visit  There are no  Patient Instructions on file for this visit.   Stacy Cain 08/21/2015, 8:41 AM

## 2015-08-30 ENCOUNTER — Other Ambulatory Visit: Payer: Self-pay | Admitting: Endocrinology

## 2015-10-01 ENCOUNTER — Other Ambulatory Visit: Payer: Self-pay | Admitting: Endocrinology

## 2015-10-04 ENCOUNTER — Other Ambulatory Visit: Payer: Self-pay | Admitting: Endocrinology

## 2015-11-17 ENCOUNTER — Other Ambulatory Visit (INDEPENDENT_AMBULATORY_CARE_PROVIDER_SITE_OTHER): Payer: Commercial Managed Care - PPO

## 2015-11-17 DIAGNOSIS — E1065 Type 1 diabetes mellitus with hyperglycemia: Secondary | ICD-10-CM | POA: Diagnosis not present

## 2015-11-17 DIAGNOSIS — E785 Hyperlipidemia, unspecified: Secondary | ICD-10-CM | POA: Diagnosis not present

## 2015-11-17 LAB — COMPREHENSIVE METABOLIC PANEL
ALBUMIN: 4.1 g/dL (ref 3.5–5.2)
ALK PHOS: 77 U/L (ref 39–117)
ALT: 13 U/L (ref 0–35)
AST: 17 U/L (ref 0–37)
BUN: 12 mg/dL (ref 6–23)
CALCIUM: 9.5 mg/dL (ref 8.4–10.5)
CHLORIDE: 106 meq/L (ref 96–112)
CO2: 26 mEq/L (ref 19–32)
Creatinine, Ser: 0.81 mg/dL (ref 0.40–1.20)
GFR: 74.29 mL/min (ref >60.00)
Glucose, Bld: 91 mg/dL (ref 70–99)
POTASSIUM: 4.3 meq/L (ref 3.5–5.1)
SODIUM: 139 meq/L (ref 135–145)
TOTAL PROTEIN: 6.9 g/dL (ref 6.0–8.3)
Total Bilirubin: 0.4 mg/dL (ref 0.2–1.2)

## 2015-11-17 LAB — LIPID PANEL
Cholesterol: 203 mg/dL — ABNORMAL HIGH (ref 0–200)
HDL: 64.4 mg/dL (ref >39.00)
LDL Cholesterol: 122 mg/dL — ABNORMAL HIGH (ref 0–99)
NONHDL: 138.49
Total CHOL/HDL Ratio: 3
Triglycerides: 82 mg/dL (ref 0.0–149.0)
VLDL: 16.4 mg/dL (ref 0.0–40.0)

## 2015-11-17 LAB — HEMOGLOBIN A1C: Hgb A1c MFr Bld: 7.1 % — ABNORMAL HIGH (ref 4.6–6.5)

## 2015-11-21 ENCOUNTER — Ambulatory Visit: Payer: Commercial Managed Care - PPO | Admitting: Endocrinology

## 2015-11-22 ENCOUNTER — Ambulatory Visit (INDEPENDENT_AMBULATORY_CARE_PROVIDER_SITE_OTHER): Payer: Commercial Managed Care - PPO | Admitting: Endocrinology

## 2015-11-22 ENCOUNTER — Encounter: Payer: Self-pay | Admitting: Endocrinology

## 2015-11-22 VITALS — BP 142/72 | HR 88 | Temp 98.0°F | Resp 14 | Ht 61.0 in | Wt 161.6 lb

## 2015-11-22 DIAGNOSIS — E782 Mixed hyperlipidemia: Secondary | ICD-10-CM | POA: Diagnosis not present

## 2015-11-22 DIAGNOSIS — E038 Other specified hypothyroidism: Secondary | ICD-10-CM

## 2015-11-22 DIAGNOSIS — E1065 Type 1 diabetes mellitus with hyperglycemia: Secondary | ICD-10-CM | POA: Diagnosis not present

## 2015-11-22 DIAGNOSIS — E063 Autoimmune thyroiditis: Secondary | ICD-10-CM

## 2015-11-22 NOTE — Progress Notes (Signed)
Patient ID: Stacy Cain, female   DOB: 12-12-1945, 70 y.o.   MRN: [id number]   Reason for Appointment:  followup:   History of Present Illness   Diagnosis: Type 1 DIABETES MELITUS, date of diagnosis:  1974  DIABETES history: She has been on an insulin pump for several years with variable control. She is fairly compliant with monitoring her blood sugars and doing some adjustments of her pump settings but continues to have variability in blood sugar  Her A1c has previously been stable between 7-7.4%  CURRENT insulin pump:  Medtronic   RECENT HISTORY:   PUMP SETTINGS are: Basal rate: Midnight = 0.6; 7 AM = 1.5,  3 PM = 1.0, 6 PM = 1.05, 10 PM = 0.8 Carb Ratio: 1: 12, 1:10 at 5 PM  Correction factor 1: 50 at 6 AM and 45 at 12 noon; 55 overnight, target 110, active insulin 4 hours  Her A1c has previously been staying around 7.8-7.9, now improved at 7.1 She is not able to send her One Touch blood sugar readings to her pump and meter was also analyzed today  Current blood sugar patterns and problems:   She has overall fairly good blood sugars most of the time but has more variability late evening and overnight  FASTING blood sugars are generally fairly good with only occasional high readings and a couple of readings in the 60s  Blood sugars have been trending to be lower after about 2 PM before her lunch and also tend to be low normal in the early evening  She has been more active in the last 3-4 weeks  However her HYPOGLYCEMIA has been occurring mostly around 3-4 PM and occasionally around 8-9 PM before supper  Usually her blood sugars are not below 60 with only 2 readings in the 50s lately  She may not be eating as many snacks as before and has less mealtime boluses and carbohydrate intake  With this she has lost some weight again  HIGHEST blood sugars are usually late at night but not consistently and may be related to variability in her diet, not clear if she may  occasionally be late for her bolus.   However she does not think she is eating out very much even when traveling  GLUCOSE CONTROL with the pump is assessed today by meter download. Checking blood sugar about 3 times a day on an average  Mean values apply above for all meters except median for One Touch  PRE-MEAL Fasting Lunch Dinner Bedtime Overall  Glucose range: 6 1-233  57-258  59-262  75-253    Mean/median: 110    130  100+/-61      EXERCISE:She is not doing any exercise even though she has a treadmill at home.     Wt Readings from Last 3 Encounters:  11/22/15 161 lb 9.6 oz (73.3 kg)  08/21/15 163 lb (73.9 kg)  05/11/15 167 lb 6.4 oz (75.9 kg)     Lab Results  Component Value Date   HGBA1C 7.1 (H) 11/17/2015   HGBA1C 6.9 08/21/2015   HGBA1C 7.8 (H) 05/08/2015   Lab Results  Component Value Date   MICROALBUR 2.3 (H) 05/08/2015   LDLCALC 122 (H) 11/17/2015   CREATININE 0.81 11/17/2015    OTHER active problems: See review of systems  Lab on 11/17/2015  Component Date Value Ref Range Status  . Hgb A1c MFr Bld 11/17/2015 7.1* 4.6 - 6.5 % Final  . Sodium 11/17/2015 139  135 -  145 mEq/L Final  . Potassium 11/17/2015 4.3  3.5 - 5.1 mEq/L Final  . Chloride 11/17/2015 106  96 - 112 mEq/L Final  . CO2 11/17/2015 26  19 - 32 mEq/L Final  . Glucose, Bld 11/17/2015 91  70 - 99 mg/dL Final  . BUN 11/17/2015 12  6 - 23 mg/dL Final  . Creatinine, Ser 11/17/2015 0.81  0.40 - 1.20 mg/dL Final  . Total Bilirubin 11/17/2015 0.4  0.2 - 1.2 mg/dL Final  . Alkaline Phosphatase 11/17/2015 77  39 - 117 U/L Final  . AST 11/17/2015 17  0 - 37 U/L Final  . ALT 11/17/2015 13  0 - 35 U/L Final  . Total Protein 11/17/2015 6.9  6.0 - 8.3 g/dL Final  . Albumin 11/17/2015 4.1  3.5 - 5.2 g/dL Final  . Calcium 11/17/2015 9.5  8.4 - 10.5 mg/dL Final  . GFR 11/17/2015 74.29  >60.00 mL/min Final  . Cholesterol 11/17/2015 203* 0 - 200 mg/dL Final  . Triglycerides 11/17/2015 82.0  0.0 - 149.0  mg/dL Final  . HDL 11/17/2015 64.40  >39.00 mg/dL Final  . VLDL 11/17/2015 16.4  0.0 - 40.0 mg/dL Final  . LDL Cholesterol 11/17/2015 122* 0 - 99 mg/dL Final  . Total CHOL/HDL Ratio 11/17/2015 3   Final  . NonHDL 11/17/2015 138.49   Final      Medication List       Accurate as of 11/22/15 12:41 PM. Always use your most recent med list.          HUMALOG 100 UNIT/ML injection Generic drug:  insulin lispro INJECT 60 UNITS UNDER THE SKIN DAILY   HUMALOG 100 UNIT/ML injection Generic drug:  insulin lispro INJECT 60 UNITS UNDER THE SKIN DAILY   levothyroxine 100 MCG tablet Commonly known as:  SYNTHROID, LEVOTHROID TAKE 1 TABLET(100 MCG) BY MOUTH DAILY   ONE TOUCH ULTRA 2 w/Device Kit Use to check blood sugar 7 times per day E10.65   ONE TOUCH ULTRA TEST test strip Generic drug:  glucose blood Use to check blood sugar 7  times per day   simvastatin 40 MG tablet Commonly known as:  ZOCOR Take 1 tablet (40 mg total) by mouth at bedtime.       Allergies:  Allergies  Allergen Reactions  . Atorvastatin     REACTION: joint pains  . Influenza Vaccines     Patient got bursitis after her vaccine  . Losartan Potassium     REACTION: blurred vision  . Metoprolol Succinate     REACTION: trigger fingers, arm pain  . Olmesartan Medoxomil   . Quinapril Hcl     REACTION: throat swelling    Past Medical History:  Diagnosis Date  . ANXIETY 10/28/2007  . BACK PAIN 12/23/2007  . CAROTID BRUIT 07/28/2009  . CORONARY ARTERY DISEASE 07/22/2007  . DIABETES MELLITUS, TYPE I 07/22/2007  . GERD 12/23/2007  . HAIR LOSS 02/17/2009  . HYPERLIPIDEMIA 07/22/2007  . HYPERTENSION 07/22/2007  . HYPOTHYROIDISM 07/22/2007  . SKIN LESION 10/27/2007  . URI 12/23/2007    Past Surgical History:  Procedure Laterality Date  . CESAREAN SECTION     x2  . CORONARY ARTERY BYPASS GRAFT    . s/p coronary stent    . TUBAL LIGATION      Family History  Problem Relation Age of Onset  . Hypertension  Mother   . Melanoma Mother   . Alcohol abuse Sister   . Arthritis Maternal Grandmother   . Diabetes  Maternal Grandmother   . Heart disease Neg Hx     Social History:  reports that she has never smoked. She does not have any smokeless tobacco history on file. She reports that she does not drink alcohol or use drugs.  REVIEW of systems:  Eye exam last  in 3/17 with Dr. Zadie Rhine , has retinopathy, Stable.  Has not needed laser and quite some time  HYPOTHYROIDISM: She has had long-standing primary hypothyroidism and previously has been on either Armour Thyroid or levothyroxine. She thinks she did not tolerate Armour Thyroid.  She has been taking 100 g , previously 88 mcg   With her increasing the dose in April her energy level is somewhat better  With taking her medication in the morning she would tend to forget and is taking this at night and is consistent with this  TSH is  normal on the last measurement:   Lab Results  Component Value Date   TSH 3.31 08/16/2015   TSH 7.25 (H) 05/08/2015   TSH 4.38 02/16/2015   FREET4 0.96 08/16/2015   FREET4 0.89 05/08/2015   FREET4 1.26 11/07/2014     ?  Hypertension She has had borderline or high blood pressure readings but refuses to take any medication Previously she had agreed to take a small dose of Diovan especially to prevent retinopathy and nephropathy but she did not start this No microalbuminuria  Home BP is not been checked Recently  Hyperlipidemia:  On several of her previous visits she has agreed to restart taking Zocor but still has not done this even though it has been explained to her why she needs to do this She said that she was busy with traveling and dealing with her husband's illness and did not think about it  Again discussed today the importance of preventing any recurrent coronary events which she does not want to take more pills.    Her lipids are not controlled although LDL relatively better.     Lab Results    Component Value Date   CHOL 203 (H) 11/17/2015   HDL 64.40 11/17/2015   LDLCALC 122 (H) 11/17/2015   LDLDIRECT 137.3 02/24/2013   TRIG 82.0 11/17/2015   CHOLHDL 3 11/17/2015     Diabetic foot exam 7/16: shows normal monofilament sensation in the toes and plantar surfaces, no skin lesions or ulcers on the feet and normal pedal pulses  EXAM:  BP (!) 142/72   Pulse 88   Temp 98 F (36.7 C)   Resp 14   Ht 5' 1"  (1.549 m)   Wt 161 lb 9.6 oz (73.3 kg)   SpO2 98%   BMI 30.53 kg/m     ASSESSMENT:  DIABETES type 1:  See history of present illness for detailed discussion of his current management, blood sugar patterns and problems identified Her A1c is now improvedAnd stable around 7%  She is fairly compliant with checking her blood sugars at least 4 times a day on an average blood readings are not transmitted to her pump as her insurance will not cover the [employer] contour She is however going to get the new 670 pump  Her most significant consistent trend is low normal sugars before lunch and supper and will need adjustment of her basal Only once appears to have had a low sugar from starting insulin boluses  As discussed above her blood sugars are variably high late in the evening and not clear why since she is usually not eating high-fat meals  Some high sugars may be related to rebound from low normal readings She does not adjust her basal rate if she is more active  Mealtime coverage appears to be fairly adequate but postprandial sugars are not very consistent  Will try to adjust basal rates with using a basal rate of 1.0 from 2 PM-6 PM and 0.975 from 6 PM-10 PM  She will check with her insurance to get coverage for the [employer] contour for the new pump  HYPOTHYROIDISM:  She will have TSH checked on the next visit  Hyperlipidemia:  She agrees to take the simvastatin medication and may try half tablet with her thyroid medication for now  Influenza vaccine:  Refused  Counseling time on subjects discussed above is over 50% of today's 25 minute visit  Patient Instructions  2 pm basal 1.0 and 6p =0.975  Start 1/2 pill at bedtime    Tome Wilson 11/22/2015, 12:41 PM

## 2015-11-22 NOTE — Patient Instructions (Signed)
2 pm basal 1.0 and 6p =0.975  Start 1/2 pill at bedtime

## 2015-11-30 ENCOUNTER — Other Ambulatory Visit: Payer: Self-pay | Admitting: Endocrinology

## 2016-01-03 ENCOUNTER — Other Ambulatory Visit: Payer: Self-pay | Admitting: Endocrinology

## 2016-01-26 ENCOUNTER — Other Ambulatory Visit: Payer: Self-pay | Admitting: Endocrinology

## 2016-01-26 NOTE — Telephone Encounter (Signed)
Rx's sent into pharmacy. Patient next appt is in 02/2016

## 2016-02-19 ENCOUNTER — Other Ambulatory Visit: Payer: Commercial Managed Care - PPO

## 2016-02-21 ENCOUNTER — Encounter: Payer: Commercial Managed Care - PPO | Admitting: Nutrition

## 2016-02-22 ENCOUNTER — Ambulatory Visit: Payer: Commercial Managed Care - PPO | Admitting: Endocrinology

## 2016-02-23 ENCOUNTER — Other Ambulatory Visit: Payer: Commercial Managed Care - PPO

## 2016-02-27 ENCOUNTER — Other Ambulatory Visit: Payer: Self-pay

## 2016-02-27 MED ORDER — GLUCOSE BLOOD VI STRP: ORAL_STRIP | 2 refills | Status: DC

## 2016-02-28 ENCOUNTER — Ambulatory Visit: Payer: Commercial Managed Care - PPO | Admitting: Endocrinology

## 2016-02-29 ENCOUNTER — Telehealth: Payer: Self-pay | Admitting: Endocrinology

## 2016-02-29 NOTE — Telephone Encounter (Signed)
[facility] Pres[facility] for a One Touch Ultra meter. Please advise  [facility] [place] [id number] [phone] (Phone) [phone] (Fax)

## 2016-03-01 ENCOUNTER — Other Ambulatory Visit: Payer: Self-pay

## 2016-03-01 MED ORDER — ONETOUCH ULTRA 2 W/DEVICE KIT: PACK | 0 refills | Status: DC

## 2016-03-04 ENCOUNTER — Other Ambulatory Visit: Payer: Self-pay

## 2016-03-04 NOTE — Telephone Encounter (Signed)
Ordered 03/01/16 

## 2016-03-06 ENCOUNTER — Encounter: Payer: Commercial Managed Care - PPO | Attending: Endocrinology | Admitting: Nutrition

## 2016-03-06 DIAGNOSIS — E1065 Type 1 diabetes mellitus with hyperglycemia: Secondary | ICD-10-CM

## 2016-03-06 DIAGNOSIS — IMO0002 Reserved for concepts with insufficient information to code with codable children: Secondary | ICD-10-CM

## 2016-03-06 DIAGNOSIS — E108 Type 1 diabetes mellitus with unspecified complications: Principal | ICD-10-CM

## 2016-03-06 NOTE — Patient Instructions (Signed)
Read over blue book on 670G insuilin pump.  Call 800 number if questions. Sign up for care link

## 2016-03-06 NOTE — Progress Notes (Signed)
Patient is here for an upgrade from her Paradigm pump to the 670G pump.  She was shown the changes to the menus, and she inserted a reservoir and signed off as understanding all the changes.  Pump settings were transferred from her old pump:  Basal rate: MN:0.6, 7AM: 1.5, 2PM: 1.0, 6PM: 0.975, 10PM: 0.8.  I/C: MN: 12, 5PM: 10, ISF: MN: 55, 6AM: 50, 12PM: 45, target: 110-110, timing 4 hours. She re demonstratated how to give a bolus correctly and had no final questions

## 2016-03-18 ENCOUNTER — Other Ambulatory Visit: Payer: Self-pay | Admitting: Family Medicine

## 2016-03-18 MED ORDER — ONETOUCH ULTRA 2 W/DEVICE KIT: PACK | 0 refills | Status: DC

## 2016-03-25 ENCOUNTER — Encounter: Payer: Commercial Managed Care - PPO | Attending: Endocrinology | Admitting: Nutrition

## 2016-03-26 NOTE — Progress Notes (Signed)
Pt. Was trained on how to insert, and use the sensor, transmiter and pump for CGM.  She insereted the sensor into her upper outer abdominal quadrant without any hesitation.  She taped it down as directed.  We reviewed how/when to calibrate the sensor, as well as the difference between sensor readings and blood sugar readings.  She signed off the checklist as understanding all topics, and had no final questions.

## 2016-03-26 NOTE — Patient Instructions (Signed)
Calibrate sensor q 8 hours.  READ over booklet on CGM.  Return in 2 weeks for Auto Mode training.

## 2016-04-08 ENCOUNTER — Encounter: Payer: Commercial Managed Care - PPO | Admitting: Nutrition

## 2016-04-10 ENCOUNTER — Encounter: Payer: Commercial Managed Care - PPO | Attending: Endocrinology | Admitting: Nutrition

## 2016-04-10 DIAGNOSIS — E1065 Type 1 diabetes mellitus with hyperglycemia: Principal | ICD-10-CM

## 2016-04-10 DIAGNOSIS — IMO0001 Reserved for inherently not codable concepts without codable children: Secondary | ICD-10-CM

## 2016-04-10 NOTE — Progress Notes (Signed)
Pt. Was trained in the auto mode via the slide presentaion.  She went into auto mode with no diffiuclty.  Re reviewed what was needed to stay in the auto mode and she reported good understanding of this. She signed off the checklist as understanding al topics and had no final questions.  She will download to care link in one week.

## 2016-04-11 NOTE — Patient Instructions (Signed)
Read manual on Auto Mode

## 2016-04-19 ENCOUNTER — Other Ambulatory Visit: Payer: Self-pay | Admitting: Endocrinology

## 2016-05-06 ENCOUNTER — Telehealth: Payer: Self-pay | Admitting: Nutrition

## 2016-05-06 NOTE — Telephone Encounter (Signed)
Would like to see her as soon as possible for 30 minute visit

## 2016-05-06 NOTE — Telephone Encounter (Signed)
Phone call.  Pt. Reports that blood sugars are always high.  FBS today was 156 (lower than normal), and the pump advised to only give 0.75 with her meal bolus of 15 grams of carb.   She was told to download to carelink, so that we can review her readings and pump settings.  She has not done this as yet.  She was told to call the Medtronic help line for help with this, and call us back with user name and password.

## 2016-05-10 ENCOUNTER — Encounter: Payer: Self-pay | Admitting: Endocrinology

## 2016-05-10 ENCOUNTER — Ambulatory Visit (INDEPENDENT_AMBULATORY_CARE_PROVIDER_SITE_OTHER): Payer: Commercial Managed Care - PPO | Admitting: Endocrinology

## 2016-05-10 VITALS — BP 144/70 | HR 82 | Ht 61.0 in | Wt 161.0 lb

## 2016-05-10 DIAGNOSIS — E063 Autoimmune thyroiditis: Secondary | ICD-10-CM

## 2016-05-10 DIAGNOSIS — E1065 Type 1 diabetes mellitus with hyperglycemia: Secondary | ICD-10-CM

## 2016-05-10 DIAGNOSIS — E038 Other specified hypothyroidism: Secondary | ICD-10-CM | POA: Diagnosis not present

## 2016-05-10 DIAGNOSIS — E782 Mixed hyperlipidemia: Secondary | ICD-10-CM

## 2016-05-10 LAB — HEMOGLOBIN A1C: Hgb A1c MFr Bld: 7.4 % — ABNORMAL HIGH (ref 4.6–6.5)

## 2016-05-10 LAB — MICROALBUMIN / CREATININE URINE RATIO
Creatinine,U: 103.9 mg/dL
MICROALB UR: 0.8 mg/dL (ref 0.0–1.9)
MICROALB/CREAT RATIO: 0.8 mg/g (ref 0.0–30.0)

## 2016-05-10 LAB — TSH: TSH: 0.7 u[IU]/mL (ref 0.35–4.50)

## 2016-05-10 NOTE — Progress Notes (Signed)
Patient ID: Stacy Cain, female   DOB: January 19, 1946, 71 y.o.   MRN: [id number]   Reason for Appointment:  followup:   History of Present Illness   Diagnosis: Type 1 DIABETES MELITUS, date of diagnosis:  1974  DIABETES history: She has been on an insulin pump for several years with variable control. She is fairly compliant with monitoring her blood sugars and doing some adjustments of her pump settings but continues to have variability in blood sugar  Her A1c has previously been stable between 7-7.4%  CURRENT insulin pump:  Medtronic 670  RECENT HISTORY:   PUMP SETTINGS are: Basal rate: Midnight = 0.6; 7 AM = 1.5,  2 PM = 1.0, 6 PM = 0.975, 10 PM = 0.8 Carb Ratio: 1: 12, 1:10 at 5 PM  Correction factor 1: 50 at 6 AM and 45 at 12 noon; 55 overnight, target 110, active insulin 4 hours  Her A1c has not been checked recently She has started the 670 pump in the auto mode on 04/10/16  Current blood sugar patterns and problems from pump download and analysis of her sensor recordings:   She has AVERAGE of 171+/-58  Has been in the auto mode 99% of the time and using the sensor 98% of the time  She is in the target range 60% of the time  FASTING blood sugars are generally fairly good with a slight rise in her blood sugar between 5-6 AM up to 9 AM on an average  Also her blood sugars are much more stable through the night with the lowest reading between 3:30-4:30 AM  She is very pleased with her new pump as well as avoiding HYPOGLYCEMIA which she was having fairly often in the afternoon and before supper time  However she is having HYPERGLYCEMIA consistently late morning until early afternoon  She appears to be exiting the auto mode fairly frequently around midday because of blood sugar going up over 300  She now says that she is drinking 2 cups of coffee in the mornings although this is over a period of a couple of hours and is not taking any bolus for that  Although she is  trying to eat some breakfast she is afraid to do so because of morning rise in blood sugars  POSTPRANDIAL readings as follows:  After BREAKFAST 303; after LUNCH 224 with pre-meal average 219; after DINNER 171 with pre-meal 160  If she is very active with shopping she will use a temporary setting of 150 target  She does not appear to have any consistently rise in blood sugar after her lunch or dinner, occasionally blood sugar is relatively higher after evening meal  She appears to have a significant REBOUND when the blood sugars are low normal    EXERCISE:She is not doing any exercise even though she has a treadmill at home.     Wt Readings from Last 3 Encounters:  05/10/16 161 lb (73 kg)  11/22/15 161 lb 9.6 oz (73.3 kg)  08/21/15 163 lb (73.9 kg)     Lab Results  Component Value Date   HGBA1C 7.1 (H) 11/17/2015   HGBA1C 6.9 08/21/2015   HGBA1C 7.8 (H) 05/08/2015   Lab Results  Component Value Date   MICROALBUR 2.3 (H) 05/08/2015   LDLCALC 122 (H) 11/17/2015   CREATININE 0.81 11/17/2015    OTHER active problems: See review of systems  No visits with results within 1 Week(s) from this visit.  Latest known visit with results  is:  Lab on 11/17/2015  Component Date Value Ref Range Status  . Hgb A1c MFr Bld 11/17/2015 7.1* 4.6 - 6.5 % Final  . Sodium 11/17/2015 139  135 - 145 mEq/L Final  . Potassium 11/17/2015 4.3  3.5 - 5.1 mEq/L Final  . Chloride 11/17/2015 106  96 - 112 mEq/L Final  . CO2 11/17/2015 26  19 - 32 mEq/L Final  . Glucose, Bld 11/17/2015 91  70 - 99 mg/dL Final  . BUN 11/17/2015 12  6 - 23 mg/dL Final  . Creatinine, Ser 11/17/2015 0.81  0.40 - 1.20 mg/dL Final  . Total Bilirubin 11/17/2015 0.4  0.2 - 1.2 mg/dL Final  . Alkaline Phosphatase 11/17/2015 77  39 - 117 U/L Final  . AST 11/17/2015 17  0 - 37 U/L Final  . ALT 11/17/2015 13  0 - 35 U/L Final  . Total Protein 11/17/2015 6.9  6.0 - 8.3 g/dL Final  . Albumin 11/17/2015 4.1  3.5 - 5.2 g/dL Final    . Calcium 11/17/2015 9.5  8.4 - 10.5 mg/dL Final  . GFR 11/17/2015 74.29  >60.00 mL/min Final  . Cholesterol 11/17/2015 203* 0 - 200 mg/dL Final  . Triglycerides 11/17/2015 82.0  0.0 - 149.0 mg/dL Final  . HDL 11/17/2015 64.40  >39.00 mg/dL Final  . VLDL 11/17/2015 16.4  0.0 - 40.0 mg/dL Final  . LDL Cholesterol 11/17/2015 122* 0 - 99 mg/dL Final  . Total CHOL/HDL Ratio 11/17/2015 3   Final  . NonHDL 11/17/2015 138.49   Final    Allergies as of 05/10/2016      Reactions   Atorvastatin    REACTION: joint pains   Influenza Vaccines    Patient got bursitis after her vaccine   Losartan Potassium    REACTION: blurred vision   Metoprolol Succinate    REACTION: trigger fingers, arm pain   Olmesartan Medoxomil    Quinapril Hcl    REACTION: throat swelling      Medication List       Accurate as of 05/10/16 11:26 AM. Always use your most recent med list.          glucose blood test strip Commonly known as:  ONE TOUCH ULTRA TEST Use to check blood sugar 7  times per day   HUMALOG 100 UNIT/ML injection Generic drug:  insulin lispro INJECT 60 UNITS UNDER THE SKIN DAILY   HUMALOG 100 UNIT/ML injection Generic drug:  insulin lispro INJECT 60 UNITS UNDER THE SKIN DAILY   HUMALOG 100 UNIT/ML injection Generic drug:  insulin lispro INJECT 60 UNITS UNDER THE SKIN DAILY   HUMALOG 100 UNIT/ML injection Generic drug:  insulin lispro INJECT 60 UNITS UNDER THE SKIN DAILY   HUMALOG 100 UNIT/ML injection Generic drug:  insulin lispro INJECT 60 UNITS UNDER THE SKIN DAILY   HUMALOG 100 UNIT/ML injection Generic drug:  insulin lispro INJECT 60 UNITS UNDER THE SKIN DAILY   levothyroxine 100 MCG tablet Commonly known as:  SYNTHROID, LEVOTHROID TAKE 1 TABLET(100 MCG) BY MOUTH DAILY   ONE TOUCH ULTRA 2 w/Device Kit Use to check blood sugar 7 times per day E10.65   simvastatin 40 MG tablet Commonly known as:  ZOCOR Take 1 tablet (40 mg total) by mouth at bedtime.        Allergies:  Allergies  Allergen Reactions  . Atorvastatin     REACTION: joint pains  . Influenza Vaccines     Patient got bursitis after her vaccine  . Losartan  Potassium     REACTION: blurred vision  . Metoprolol Succinate     REACTION: trigger fingers, arm pain  . Olmesartan Medoxomil   . Quinapril Hcl     REACTION: throat swelling    Past Medical History:  Diagnosis Date  . ANXIETY 10/28/2007  . BACK PAIN 12/23/2007  . CAROTID BRUIT 07/28/2009  . CORONARY ARTERY DISEASE 07/22/2007  . DIABETES MELLITUS, TYPE I 07/22/2007  . GERD 12/23/2007  . HAIR LOSS 02/17/2009  . HYPERLIPIDEMIA 07/22/2007  . HYPERTENSION 07/22/2007  . HYPOTHYROIDISM 07/22/2007  . SKIN LESION 10/27/2007  . URI 12/23/2007    Past Surgical History:  Procedure Laterality Date  . CESAREAN SECTION     x2  . CORONARY ARTERY BYPASS GRAFT    . s/p coronary stent    . TUBAL LIGATION      Family History  Problem Relation Age of Onset  . Hypertension Mother   . Melanoma Mother   . Alcohol abuse Sister   . Arthritis Maternal Grandmother   . Diabetes Maternal Grandmother   . Heart disease Neg Hx     Social History:  reports that she has never smoked. She has never used smokeless tobacco. She reports that she does not drink alcohol or use drugs.  REVIEW of systems:  Eye exam last  in 3/17 with Dr. Zadie Rhine , has retinopathy, Stable.  Has not needed laser and quite some time  HYPOTHYROIDISM: She has had long-standing primary hypothyroidism and previously has been on either Armour Thyroid or levothyroxine. She thinks she did not tolerate Armour Thyroid.  She has been taking 100 g , previously 88 mcg   With her increasing the dose in April her energy level is somewhat better  With taking her medication in the morning she would tend to forget and is taking this at night and is consistent with this  TSH is  normal on the last measurement:   Lab Results  Component Value Date   TSH 3.31 08/16/2015    TSH 7.25 (H) 05/08/2015   TSH 4.38 02/16/2015   FREET4 0.96 08/16/2015   FREET4 0.89 05/08/2015   FREET4 1.26 11/07/2014     ?  Hypertension She has had borderline or high blood pressure readings but refuses to take any medication Previously she had agreed to take a small dose of Diovan especially to prevent retinopathy and nephropathy but she did not start this No microalbuminuria  Home BP is not been checked Recently  Hyperlipidemia:  On several of her previous visits she has agreed to restart taking Zocor but still has not done this even though it has been explained to her why she needs to do this She said that she was busy with traveling and dealing with her husband's illness and did not think about it  Again discussed today the importance of preventing any recurrent coronary events which she does not want to take more pills.    Her lipids are not controlled although LDL relatively better.     Lab Results  Component Value Date   CHOL 203 (H) 11/17/2015   HDL 64.40 11/17/2015   LDLCALC 122 (H) 11/17/2015   LDLDIRECT 137.3 02/24/2013   TRIG 82.0 11/17/2015   CHOLHDL 3 11/17/2015     Diabetic foot exam 7/16: shows normal monofilament sensation in the toes and plantar surfaces, no skin lesions or ulcers on the feet and normal pedal pulses  EXAM:  BP (!) 144/70   Pulse 82   Ht 5'  1" (1.549 m)   Wt 161 lb (73 kg)   BMI 30.42 kg/m     ASSESSMENT:  DIABETES type 1:  See history of present illness for detailed discussion of current management, blood sugar patterns and problems identified  She is having significant problems with hyperglycemia midday even with the auto mode This appears to be related to a combination of Dawn phenomenon and need for boluses in the morning for her coffee Blood sugars appear to be going up fairly quickly in the morning hours and unable to get enough basal rate to cover the rise; this is despite her eating usually very little carbohydrate in  the morning for breakfast most of the time Other active problems include tendency to rebound from low normal sugars which may be related to the active insulin time of 4 hours and this is not allowing the basal to increase back up again soon enough She is otherwise doing very well with avoiding hypoglycemia except on one occasion which may have been related to overcorrection of high reading  Recommendations:  She needs to bolus at least 1-2 units for each cup of coffee in the morning to prevent blood sugar spikes  Carbohydrate coverage 1:9 at breakfast instead of 1: 12  Active insulin 3-1/2 hours Mealtime coverage appears to be fairly adequate but postprandial sugars most likely tend to go higher after breakfast when she is more insulin resistant This really to be assessed again once her previous blood sugars are improved especially at lunch   HYPOTHYROIDISM:  She will have TSH checked today  Hyperlipidemia:  She still does not want to start simvastatin despite constant discussion over the last few years   Counseling time on subjects discussed above is over 50% of today's 25 minute visit  There are no Patient Instructions on file for this visit.   Rilen Shukla 05/10/2016, 11:26 AM

## 2016-05-10 NOTE — Patient Instructions (Signed)
Active insulin 3.5 hrs  Carb ratio 1:9 in am and may be 1:8  Bolus 1-2 units for coffee

## 2016-05-15 NOTE — Telephone Encounter (Signed)
Message left on machine that Dr. Lucianne Muss would like to see her /asap, and that we are continuing to try to get her test strips approved.  Dr. Lucianne Muss must Email someone from [facility] to get an approval.  Email address left on his desk with instructions.

## 2016-05-19 ENCOUNTER — Encounter: Payer: Self-pay | Admitting: Endocrinology

## 2016-05-19 DIAGNOSIS — Z7689 Persons encountering health services in other specified circumstances: Secondary | ICD-10-CM

## 2016-05-27 ENCOUNTER — Other Ambulatory Visit: Payer: Self-pay

## 2016-05-27 ENCOUNTER — Other Ambulatory Visit: Payer: Commercial Managed Care - PPO

## 2016-05-27 MED ORDER — LEVOTHYROXINE SODIUM 100 MCG PO TABS: ORAL_TABLET | ORAL | 1 refills | Status: DC

## 2016-05-28 ENCOUNTER — Other Ambulatory Visit: Payer: Self-pay

## 2016-05-30 ENCOUNTER — Telehealth: Payer: Self-pay | Admitting: Endocrinology

## 2016-05-30 ENCOUNTER — Other Ambulatory Visit: Payer: Self-pay | Admitting: Endocrinology

## 2016-05-30 MED ORDER — INSULIN LISPRO 100 UNIT/ML ~~LOC~~ SOLN: SUBCUTANEOUS | 2 refills | Status: DC

## 2016-05-30 NOTE — Telephone Encounter (Signed)
[facility] have sent over fax to concerning patient insulin, HUMALOG 100 UNIT/ML injection will close out if not  [facility] - [place], [place] - [address]

## 2016-05-31 ENCOUNTER — Ambulatory Visit: Payer: Commercial Managed Care - PPO | Admitting: Endocrinology

## 2016-06-24 ENCOUNTER — Other Ambulatory Visit (INDEPENDENT_AMBULATORY_CARE_PROVIDER_SITE_OTHER): Payer: Commercial Managed Care - PPO

## 2016-06-24 DIAGNOSIS — E1065 Type 1 diabetes mellitus with hyperglycemia: Secondary | ICD-10-CM | POA: Diagnosis not present

## 2016-06-24 DIAGNOSIS — E782 Mixed hyperlipidemia: Secondary | ICD-10-CM

## 2016-06-24 LAB — LIPID PANEL
Cholesterol: 216 mg/dL — ABNORMAL HIGH (ref 0–200)
HDL: 64.8 mg/dL (ref >39.00)
LDL CALC: 134 mg/dL — AB (ref 0–99)
NonHDL: 150.78
Total CHOL/HDL Ratio: 3
Triglycerides: 85 mg/dL (ref 0.0–149.0)
VLDL: 17 mg/dL (ref 0.0–40.0)

## 2016-06-24 LAB — COMPREHENSIVE METABOLIC PANEL
ALBUMIN: 4.1 g/dL (ref 3.5–5.2)
ALK PHOS: 86 U/L (ref 39–117)
ALT: 11 U/L (ref 0–35)
AST: 15 U/L (ref 0–37)
BUN: 15 mg/dL (ref 6–23)
CHLORIDE: 101 meq/L (ref 96–112)
CO2: 27 mEq/L (ref 19–32)
Calcium: 9.5 mg/dL (ref 8.4–10.5)
Creatinine, Ser: 1.05 mg/dL (ref 0.40–1.20)
GFR: 54.97 mL/min — AB (ref >60.00)
Glucose, Bld: 192 mg/dL — ABNORMAL HIGH (ref 70–99)
Potassium: 4.8 mEq/L (ref 3.5–5.1)
SODIUM: 134 meq/L — AB (ref 135–145)
TOTAL PROTEIN: 7.1 g/dL (ref 6.0–8.3)
Total Bilirubin: 0.5 mg/dL (ref 0.2–1.2)

## 2016-06-25 LAB — FRUCTOSAMINE: Fructosamine: 301 umol/L — ABNORMAL HIGH (ref 0–285)

## 2016-06-27 ENCOUNTER — Encounter: Payer: Self-pay | Admitting: Endocrinology

## 2016-06-27 ENCOUNTER — Ambulatory Visit (INDEPENDENT_AMBULATORY_CARE_PROVIDER_SITE_OTHER): Payer: Commercial Managed Care - PPO | Admitting: Endocrinology

## 2016-06-27 VITALS — BP 181/81 | HR 81 | Ht 61.0 in | Wt 161.0 lb

## 2016-06-27 DIAGNOSIS — E782 Mixed hyperlipidemia: Secondary | ICD-10-CM | POA: Diagnosis not present

## 2016-06-27 DIAGNOSIS — E063 Autoimmune thyroiditis: Secondary | ICD-10-CM | POA: Diagnosis not present

## 2016-06-27 DIAGNOSIS — E1065 Type 1 diabetes mellitus with hyperglycemia: Secondary | ICD-10-CM

## 2016-06-27 DIAGNOSIS — I10 Essential (primary) hypertension: Secondary | ICD-10-CM

## 2016-06-27 MED ORDER — SIMVASTATIN 40 MG PO TABS: 40.0000 mg | ORAL_TABLET | Freq: Every day | ORAL | 3 refills | Status: DC

## 2016-06-27 NOTE — Progress Notes (Signed)
Patient ID: Stacy Cain, female   DOB: [birth year], 71 y.o.   MRN: [id number]   Reason for Appointment:  followup:   History of Present Illness   Diagnosis: Type 1 DIABETES MELITUS, date of diagnosis:  1974  DIABETES history: She has been on an insulin pump for several years with variable control. She is fairly compliant with monitoring her blood sugars and doing some adjustments of her pump settings but continues to have variability in blood sugar  Her A1c has previously been stable between 7-7.4%  CURRENT insulin pump:  Medtronic 670  RECENT HISTORY:   PUMP SETTINGS are: Basal rate: Midnight = 0.6; 7 AM = 1.5,  2 PM = 1.0, 6 PM = 0.975, 10 PM = 0.8 Carb Ratio: 1: 12, 1:10 at 5 PM  Correction factor 1: 50 at 6 AM and 45 at 12 noon; 55 overnight, target 110, active insulin 4 hours  Her A1c has been relatively higher this year at 7.4, last year 6.9  She has started the 670 pump in the auto mode on 04/10/16  Current blood sugar patterns and problems from pump download and analysis of her sensor recordings:   She has since her glucose AVERAGE of 157, previously 171+/-58  Has been in the auto mode 97 % of the time and using the sensor 94 % of the time  She is in the target range 70% of the time compared to previous visit of 60% of the time  FASTING and overnight blood sugars are generally fairly good with average about 140  Again she has a significant rise of her blood sugar starting around 9 AM fairly consistently although to a variable extent  She was told that she needs to take a bolus for her coffee in the morning regardless of blood sugar and food intake but she is not appearing to be doing this; her first BOLUS is generally after 10 AM and mostly after 11 AM  No hypoglycemia recorded and does not have any blood sugars below 70  POSTPRANDIAL readings as below  After BREAKFAST ?  previously 303; after LUNCH 199, previously 224 and after DINNER 146, previously 171    Pre-meal blood sugars 191 at lunch and 160 at supper time  She appears to be having several small boluses in the afternoons and evenings and not clear when she is actually having her main meal  If she is very active with shopping she will use a temporary setting of 150 target  She overall feels somewhat better subjectively even though she has some high readings during the day with average reading just over 200 at 12 noon and not coming down significantly until after 5 PM    EXERCISE:She is not doing any exercise.     Wt Readings from Last 3 Encounters:  06/27/16 161 lb (73 kg)  05/10/16 161 lb (73 kg)  11/22/15 161 lb 9.6 oz (73.3 kg)     Lab Results  Component Value Date   HGBA1C 7.4 (H) 05/10/2016   HGBA1C 7.1 (H) 11/17/2015   HGBA1C 6.9 08/21/2015   Lab Results  Component Value Date   MICROALBUR 0.8 05/10/2016   LDLCALC 134 (H) 06/24/2016   CREATININE 1.05 06/24/2016    OTHER active problems: See review of systems  Lab on 06/24/2016  Component Date Value Ref Range Status  . Fructosamine 06/24/2016 301* 0 - 285 umol/L Final   Comment: Published reference interval for apparently healthy subjects between age 59 and 43 is  205 - 285 umol/L and in a poorly controlled diabetic population is 228 - 563 umol/L with a mean of 396 umol/L.   Marland Kitchen Sodium 06/24/2016 134* 135 - 145 mEq/L Final  . Potassium 06/24/2016 4.8  3.5 - 5.1 mEq/L Final  . Chloride 06/24/2016 101  96 - 112 mEq/L Final  . CO2 06/24/2016 27  19 - 32 mEq/L Final  . Glucose, Bld 06/24/2016 192* 70 - 99 mg/dL Final  . BUN 06/24/2016 15  6 - 23 mg/dL Final  . Creatinine, Ser 06/24/2016 1.05  0.40 - 1.20 mg/dL Final  . Total Bilirubin 06/24/2016 0.5  0.2 - 1.2 mg/dL Final  . Alkaline Phosphatase 06/24/2016 86  39 - 117 U/L Final  . AST 06/24/2016 15  0 - 37 U/L Final  . ALT 06/24/2016 11  0 - 35 U/L Final  . Total Protein 06/24/2016 7.1  6.0 - 8.3 g/dL Final  . Albumin 06/24/2016 4.1  3.5 - 5.2 g/dL Final   . Calcium 06/24/2016 9.5  8.4 - 10.5 mg/dL Final  . GFR 06/24/2016 54.97* >60.00 mL/min Final  . Cholesterol 06/24/2016 216* 0 - 200 mg/dL Final   ATP III Classification       Desirable:  < 200 mg/dL               Borderline High:  200 - 239 mg/dL          High:  > = 240 mg/dL  . Triglycerides 06/24/2016 85.0  0.0 - 149.0 mg/dL Final   Normal:  <150 mg/dLBorderline High:  150 - 199 mg/dL  . HDL 06/24/2016 64.80  >39.00 mg/dL Final  . VLDL 06/24/2016 17.0  0.0 - 40.0 mg/dL Final  . LDL Cholesterol 06/24/2016 134* 0 - 99 mg/dL Final  . Total CHOL/HDL Ratio 06/24/2016 3   Final                  Men          Women1/2 Average Risk     3.4          3.3Average Risk          5.0          4.42X Average Risk          9.6          7.13X Average Risk          15.0          11.0                      . NonHDL 06/24/2016 150.78   Final   NOTE:  Non-HDL goal should be 30 mg/dL higher than patient's LDL goal (i.e. LDL goal of < 70 mg/dL, would have non-HDL goal of < 100 mg/dL)    Allergies as of 06/27/2016      Reactions   Atorvastatin    REACTION: joint pains   Influenza Vaccines    Patient got bursitis after her vaccine   Losartan Potassium    REACTION: blurred vision   Metoprolol Succinate    REACTION: trigger fingers, arm pain   Olmesartan Medoxomil    Quinapril Hcl    REACTION: throat swelling      Medication List       Accurate as of 06/27/16  2:30 PM. Always use your most recent med list.          glucose blood test strip Commonly known as:  ONE  TOUCH ULTRA TEST Use to check blood sugar 7  times per day   insulin lispro 100 UNIT/ML injection Commonly known as:  HUMALOG Use in insulin pump, 60 UNITS DAILY   levothyroxine 100 MCG tablet Commonly known as:  SYNTHROID, LEVOTHROID TAKE 1 TABLET(100 MCG) BY MOUTH DAILY   ONE TOUCH ULTRA 2 w/Device Kit Use to check blood sugar 7 times per day E10.65   simvastatin 40 MG tablet Commonly known as:  ZOCOR Take 1 tablet (40 mg total)  by mouth at bedtime.       Allergies:  Allergies  Allergen Reactions  . Atorvastatin     REACTION: joint pains  . Influenza Vaccines     Patient got bursitis after her vaccine  . Losartan Potassium     REACTION: blurred vision  . Metoprolol Succinate     REACTION: trigger fingers, arm pain  . Olmesartan Medoxomil   . Quinapril Hcl     REACTION: throat swelling    Past Medical History:  Diagnosis Date  . ANXIETY 10/28/2007  . BACK PAIN 12/23/2007  . CAROTID BRUIT 07/28/2009  . CORONARY ARTERY DISEASE 07/22/2007  . DIABETES MELLITUS, TYPE I 07/22/2007  . GERD 12/23/2007  . HAIR LOSS 02/17/2009  . HYPERLIPIDEMIA 07/22/2007  . HYPERTENSION 07/22/2007  . HYPOTHYROIDISM 07/22/2007  . SKIN LESION 10/27/2007  . URI 12/23/2007    Past Surgical History:  Procedure Laterality Date  . CESAREAN SECTION     x2  . CORONARY ARTERY BYPASS GRAFT    . s/p coronary stent    . TUBAL LIGATION      Family History  Problem Relation Age of Onset  . Hypertension Mother   . Melanoma Mother   . Alcohol abuse Sister   . Arthritis Maternal Grandmother   . Diabetes Maternal Grandmother   . Heart disease Neg Hx     Social History:  reports that she has never smoked. She has never used smokeless tobacco. She reports that she does not drink alcohol or use drugs.  REVIEW of systems:  Eye exam last  in 3/17 with Dr. Zadie Rhine , has retinopathy, Stable.   HYPOTHYROIDISM: She has had long-standing primary hypothyroidism and previously has been on either Armour Thyroid or levothyroxine. She thinks she did not tolerate Armour Thyroid.  She has been taking 100 g , previously 88 mcg   With her increasing the dose in April her energy level is somewhat better  With taking her medication in the morning she would tend to forget and is taking this at night and is consistent with this  TSH is  normal on the last measurement:   Lab Results  Component Value Date   TSH 0.70 05/10/2016   TSH 3.31  08/16/2015   TSH 7.25 (H) 05/08/2015   FREET4 0.96 08/16/2015   FREET4 0.89 05/08/2015   FREET4 1.26 11/07/2014     ?  Hypertension She has had borderline or high blood pressure readings but refuses to take any medication Previously she had agreed to take a small dose of Diovan especially to prevent retinopathy and nephropathy but she did not start this No microalbuminuria  Home BP is not been checked for some time, she says that she has an old meter  Hyperlipidemia:  On several of her previous visits she has agreed to restart taking Zocor but still has not done this even though it has been explained to her why she needs to do this She again has various excuses why she  has not started this and now is asking for a prescription    Her lipids are not controlled and LDL higher than previous 1   Lab Results  Component Value Date   CHOL 216 (H) 06/24/2016   HDL 64.80 06/24/2016   LDLCALC 134 (H) 06/24/2016   LDLDIRECT 137.3 02/24/2013   TRIG 85.0 06/24/2016   CHOLHDL 3 06/24/2016     Diabetic foot exam 7/16: shows normal monofilament sensation in the toes and plantar surfaces, no skin lesions or ulcers on the feet and normal pedal pulses  EXAM:  BP (!) 181/81   Pulse 81   Ht 5' 1"  (1.549 m)   Wt 161 lb (73 kg)   SpO2 97%   BMI 30.42 kg/m   Repeat blood pressure about the same  ASSESSMENT:  DIABETES type 1:  See history of present illness for detailed discussion of current management, blood sugar patterns and problems identified  Her blood sugars are somewhat better than on her last visit and not having as much hyperglycemia Postprandial readings are better with adjusting her carbohydrate coverage previously and also active insulin time  However she appears to have a significant Dawn phenomenon with blood sugar rising within 30 minutes of her waking up Despite recommendations she is does not bolus at that time even when drinking coffee Postprandial readings are fairly  well controlled Also does not have any blood sugars below 70 currently on her sensor   Recommendations:  She needs to bolus at least 1.5 -2 units after waking up in the morning especially when she sees her blood sugar rising on the sensor  This is despite whether she is eating or drinking coffee in the morning are not  Continue same carbohydrate coverage now  Day-to-day management of her, Sensors and diabetes in general discussed  HYPERTENSION: She may have whitecoat syndrome and will need to start checking her blood pressure at home, she will get an [employer] monitor If blood pressure is high at home she will see her PCP right away  HYPOTHYROIDISM:  She will have TSH checked on follow-up  Hyperlipidemia:  She appears to be agreeable to taking her simvastatin now and she can take this at the same time as her Synthroid Prescription sent   Counseling time on subjects discussed above is over 50% of today's 25 minute visit  There are no Patient Instructions on file for this visit.   Tapanga Ottaway 06/27/2016, 2:30 PM

## 2016-06-27 NOTE — Patient Instructions (Addendum)
Must take zocor with Synthroid

## 2016-09-02 ENCOUNTER — Other Ambulatory Visit: Payer: Self-pay | Admitting: Endocrinology

## 2016-09-02 ENCOUNTER — Encounter: Payer: Self-pay | Admitting: Endocrinology

## 2016-09-02 NOTE — Telephone Encounter (Signed)
Please advise 

## 2016-09-02 NOTE — Telephone Encounter (Signed)
Please find out her exact issue.  If she wants brand name Synthroid that is okay, same dose

## 2016-09-02 NOTE — Telephone Encounter (Signed)
Is not feeling well on the off brand she feels lethargic, swelling in both legs right more than left, tightness in chest.

## 2016-09-02 NOTE — Telephone Encounter (Signed)
Routing to you °

## 2016-09-02 NOTE — Telephone Encounter (Signed)
Patient is having problem with Mylan-levothyrox and would like to go back to what she was taking before.  Please call her back to discuss.  Thank you,   -LL

## 2016-09-02 NOTE — Telephone Encounter (Signed)
All the symptoms are unlikely to be coming from the levothyroxine, she does need to see her PCP for the swelling and chest pain.  We can still send her brand name Synthroid, same dose as current levothyroxine

## 2016-09-02 NOTE — Telephone Encounter (Signed)
Called patient and left a voice message to please call back and let us know what her symptoms are and to also let us know if it is the Synthroid that she was taking and wants to go back on.

## 2016-09-02 NOTE — Telephone Encounter (Signed)
Patient returning your call.  TY,  -LL

## 2016-09-03 ENCOUNTER — Other Ambulatory Visit: Payer: Self-pay | Admitting: Endocrinology

## 2016-09-03 MED ORDER — SYNTHROID 100 MCG PO TABS
100.0000 ug | ORAL_TABLET | Freq: Every day | ORAL | 3 refills | Status: DC
Start: 2016-09-03 — End: 2016-11-03

## 2016-09-03 NOTE — Telephone Encounter (Signed)
Called patient and she stated that she has resolved this issue herself. She stated they Mylan was causing her problems and she went to the pharmacy and bought the Synthroid for $10 out of pocket and did not go through her insurance company since they wanted her to take the Mylan which was making her sick.

## 2016-09-03 NOTE — Telephone Encounter (Signed)
I have send a prescription for brand name Synthroid, the prescription that was sent yesterday was still Generic

## 2016-09-17 ENCOUNTER — Telehealth: Payer: Self-pay | Admitting: Nutrition

## 2016-09-17 NOTE — Telephone Encounter (Signed)
Old telephone number is no longer in service.  Pt. Is needing a C-peptide and FBS for Medicare to pay for pump supplies.  Pt. Is needing to make an appt. To have this done here at the lab.

## 2016-09-22 ENCOUNTER — Other Ambulatory Visit: Payer: Self-pay | Admitting: Endocrinology

## 2016-09-22 DIAGNOSIS — E1065 Type 1 diabetes mellitus with hyperglycemia: Secondary | ICD-10-CM

## 2016-09-23 ENCOUNTER — Other Ambulatory Visit (INDEPENDENT_AMBULATORY_CARE_PROVIDER_SITE_OTHER): Payer: Commercial Managed Care - PPO

## 2016-09-23 DIAGNOSIS — E1065 Type 1 diabetes mellitus with hyperglycemia: Secondary | ICD-10-CM | POA: Diagnosis not present

## 2016-09-23 DIAGNOSIS — E782 Mixed hyperlipidemia: Secondary | ICD-10-CM

## 2016-09-23 DIAGNOSIS — E063 Autoimmune thyroiditis: Secondary | ICD-10-CM

## 2016-09-23 LAB — COMPREHENSIVE METABOLIC PANEL
ALT: 8 U/L (ref 0–35)
AST: 13 U/L (ref 0–37)
Albumin: 3.8 g/dL (ref 3.5–5.2)
Alkaline Phosphatase: 76 U/L (ref 39–117)
BUN: 16 mg/dL (ref 6–23)
CALCIUM: 9.3 mg/dL (ref 8.4–10.5)
CHLORIDE: 104 meq/L (ref 96–112)
CO2: 24 meq/L (ref 19–32)
CREATININE: 0.88 mg/dL (ref 0.40–1.20)
GFR: 67.35 mL/min (ref >60.00)
GLUCOSE: 162 mg/dL — AB (ref 70–99)
Potassium: 4.5 mEq/L (ref 3.5–5.1)
SODIUM: 137 meq/L (ref 135–145)
Total Bilirubin: 0.5 mg/dL (ref 0.2–1.2)
Total Protein: 7.4 g/dL (ref 6.0–8.3)

## 2016-09-23 LAB — LIPID PANEL
CHOL/HDL RATIO: 3
Cholesterol: 201 mg/dL — ABNORMAL HIGH (ref 0–200)
HDL: 68.1 mg/dL (ref >39.00)
LDL CALC: 115 mg/dL — AB (ref 0–99)
NONHDL: 132.55
TRIGLYCERIDES: 90 mg/dL (ref 0.0–149.0)
VLDL: 18 mg/dL (ref 0.0–40.0)

## 2016-09-23 LAB — T4, FREE: FREE T4: 1.3 ng/dL (ref 0.60–1.60)

## 2016-09-23 LAB — HEMOGLOBIN A1C: HEMOGLOBIN A1C: 7.5 % — AB (ref 4.6–6.5)

## 2016-09-23 LAB — TSH: TSH: 0.88 u[IU]/mL (ref 0.35–4.50)

## 2016-09-24 ENCOUNTER — Telehealth: Payer: Self-pay | Admitting: Nutrition

## 2016-09-24 LAB — C-PEPTIDE: C-Peptide: <0.1 ng/mL — ABNORMAL LOW (ref 1.1–4.4)

## 2016-09-25 LAB — SPECIMEN STATUS REPORT

## 2016-09-25 NOTE — Telephone Encounter (Signed)
Opened in error

## 2016-09-26 LAB — GLUCOSE, RANDOM: GLUCOSE: 157 mg/dL — AB (ref 65–99)

## 2016-09-26 LAB — SPECIMEN STATUS REPORT

## 2016-09-27 ENCOUNTER — Ambulatory Visit (INDEPENDENT_AMBULATORY_CARE_PROVIDER_SITE_OTHER): Payer: Commercial Managed Care - PPO | Admitting: Endocrinology

## 2016-09-27 ENCOUNTER — Encounter: Payer: Self-pay | Admitting: Endocrinology

## 2016-09-27 VITALS — BP 160/84 | HR 87 | Ht 61.0 in | Wt 159.2 lb

## 2016-09-27 DIAGNOSIS — I10 Essential (primary) hypertension: Secondary | ICD-10-CM | POA: Diagnosis not present

## 2016-09-27 DIAGNOSIS — E782 Mixed hyperlipidemia: Secondary | ICD-10-CM | POA: Diagnosis not present

## 2016-09-27 DIAGNOSIS — E1065 Type 1 diabetes mellitus with hyperglycemia: Secondary | ICD-10-CM

## 2016-09-27 NOTE — Progress Notes (Signed)
Patient ID: Stacy Cain, female   DOB: 21-Dec-1945, 71 y.o.   MRN: [id number]   Reason for Appointment:  followup:   History of Present Illness   Diagnosis: Type 1 DIABETES MELITUS, date of diagnosis:  1974  DIABETES history: She has been on an insulin pump for several years with variable control. She is fairly compliant with monitoring her blood sugars and doing some adjustments of her pump settings but continues to have variability in blood sugar  Her A1c has previously been stable between 7-7.4%  CURRENT insulin pump:  Medtronic 670  RECENT HISTORY:   PUMP SETTINGS are: Basal rate: Midnight = 0.6; 7 AM = 1.5,  2 PM = 1.0, 6 PM = 0.975, 10 PM = 0.8 Carb Ratio: 1: 12, 1:10 at 5 PM   Correction factor 1: 50 at 6 AM and 45 at 12 noon; 55 overnight, target 110, active insulin 3-1/2 hours  Her A1c has been relatively higher this year at 7.4, last year 6.9  She has has been on the 670 pump and in the auto mode since 04/10/16  Current blood sugar data and problems from pump download and analysis of her sensor recordings:   She has since her glucose AVERAGE of 155+/-61  Has been in the auto mode 94 % of the time  She is in the target range 72% of the time compared to previous 70% of the time  Patterns:  FASTING and overnight blood sugars are generally fairly good with less variability and pump within target range  Again she has a variable and sometimes significant rise of her blood sugar starting around 9 AM   She does start to bolus at that time based on her blood sugar level and then but not always able to control the hyperglycemia  HIGHEST blood sugars around 12 noon with some variability  She is likely to be from POSTPRANDIAL blood sugars going up after eating cereal in the morning, however she is not always eating in the morning and there is not enough data on some days  She also tries to bolus variable amounts coffee intake in the morning also    She has fairly  good blood sugars postprandially without much rise after lunch and supper overall with occasional variability  Also blood sugars are generally coming down it 2-3 hours.  However she may have low normal or relatively low blood sugars 2-3 hours after her evening meal.  She now says that she is bolusing a little extra on her own for the evening meal to control blood sugar rise    EXERCISE:She is not doing any exercise despite reminders    Mean values apply above for all meters except median for One Touch  PRE-MEAL Fasting Lunch Dinner Bedtime Overall  Glucose range:       Mean/median: 166  190  162   155+/-47    POST-MEAL PC Breakfast PC Lunch PC Dinner  Glucose range:     Mean/median: 296  196  156     Wt Readings from Last 3 Encounters:  09/27/16 159 lb 3.2 oz (72.2 kg)  06/27/16 161 lb (73 kg)  05/10/16 161 lb (73 kg)     Lab Results  Component Value Date   HGBA1C 7.5 (H) 09/23/2016   HGBA1C 7.4 (H) 05/10/2016   HGBA1C 7.1 (H) 11/17/2015   Lab Results  Component Value Date   MICROALBUR 0.8 05/10/2016   LDLCALC 115 (H) 09/23/2016   CREATININE 0.88 09/23/2016  OTHER active problems: See review of systems  Lab on 09/23/2016  Component Date Value Ref Range Status  . Hgb A1c MFr Bld 09/23/2016 7.5* 4.6 - 6.5 % Final   Glycemic Control Guidelines for People with Diabetes:Non Diabetic:  <6%Goal of Therapy: <7%Additional Action Suggested:  >8%   . Sodium 09/23/2016 137  135 - 145 mEq/L Final  . Potassium 09/23/2016 4.5  3.5 - 5.1 mEq/L Final  . Chloride 09/23/2016 104  96 - 112 mEq/L Final  . CO2 09/23/2016 24  19 - 32 mEq/L Final  . Glucose, Bld 09/23/2016 162* 70 - 99 mg/dL Final  . BUN 09/23/2016 16  6 - 23 mg/dL Final  . Creatinine, Ser 09/23/2016 0.88  0.40 - 1.20 mg/dL Final  . Total Bilirubin 09/23/2016 0.5  0.2 - 1.2 mg/dL Final  . Alkaline Phosphatase 09/23/2016 76  39 - 117 U/L Final  . AST 09/23/2016 13  0 - 37 U/L Final  . ALT 09/23/2016 8  0 - 35 U/L  Final  . Total Protein 09/23/2016 7.4  6.0 - 8.3 g/dL Final  . Albumin 09/23/2016 3.8  3.5 - 5.2 g/dL Final  . Calcium 09/23/2016 9.3  8.4 - 10.5 mg/dL Final  . GFR 09/23/2016 67.35  >60.00 mL/min Final  . Cholesterol 09/23/2016 201* 0 - 200 mg/dL Final   ATP III Classification       Desirable:  < 200 mg/dL               Borderline High:  200 - 239 mg/dL          High:  > = 240 mg/dL  . Triglycerides 09/23/2016 90.0  0.0 - 149.0 mg/dL Final   Normal:  <150 mg/dLBorderline High:  150 - 199 mg/dL  . HDL 09/23/2016 68.10  >39.00 mg/dL Final  . VLDL 09/23/2016 18.0  0.0 - 40.0 mg/dL Final  . LDL Cholesterol 09/23/2016 115* 0 - 99 mg/dL Final  . Total CHOL/HDL Ratio 09/23/2016 3   Final                  Men          Women1/2 Average Risk     3.4          3.3Average Risk          5.0          4.42X Average Risk          9.6          7.13X Average Risk          15.0          11.0                      . NonHDL 09/23/2016 132.55   Final   NOTE:  Non-HDL goal should be 30 mg/dL higher than patient's LDL goal (i.e. LDL goal of < 70 mg/dL, would have non-HDL goal of < 100 mg/dL)  . TSH 09/23/2016 0.88  0.35 - 4.50 uIU/mL Final  . Free T4 09/23/2016 1.30  0.60 - 1.60 ng/dL Final   Comment: Specimens from patients who are undergoing biotin therapy and /or ingesting biotin supplements may contain high levels of biotin.  The higher biotin concentration in these specimens interferes with this Free T4 assay.  Specimens that contain high levels  of biotin may cause false high results for this Free T4 assay.  Please interpret results in light of the total  clinical presentation of the patient.    . C-Peptide 09/23/2016 <0.1* 1.1 - 4.4 ng/mL Final   C-Peptide reference interval is for fasting patients.  Marland Kitchen specimen status report 09/23/2016 Comment   Final   Comment: Verbal Order See below: Comment: Please provide requested information and fax to [phone] or [phone]. The [place] Code of  [employer] requires a written and signed request be forwarded to a laboratory following a verbal order of a laboratory test.  Please assist [place] to meet this requirement and to complete our records. Date:______________________________ ICD-9/10 Diagnosis Code(s):___________________________________________ Physician or Authorized Designee:_____________________________________                                               Please Print Physician or Authorized Designee Signature: ______________________________________________________________________ Your Animal nutritionist Your Order Of The Test(s) Listed Additional Test(s) Requested Comment: Test(s) added per penny STONEELMORE at account 09-25-2016 Logged by [place] Reed Test# [phone] Glucose Diagnosis Codes Provided    E10.65   . Glucose 09/23/2016 157* 65 - 99 mg/dL Final  . specimen status report 09/23/2016 Comment   Final   Comment: Written Authorization Written Authorization Written Authorization Received. Authorization received from penny stone-elmore 09-26-2016 Logged by Janeal Holmes HERRING     Allergies as of 09/27/2016      Reactions   Atorvastatin    REACTION: joint pains   Influenza Vaccines    Patient got bursitis after her vaccine   Losartan Potassium    REACTION: blurred vision   Metoprolol Succinate    REACTION: trigger fingers, arm pain   Olmesartan Medoxomil    Quinapril Hcl    REACTION: throat swelling      Medication List       Accurate as of 09/27/16 10:40 AM. Always use your most recent med list.          glucose blood test strip Commonly known as:  ONE TOUCH ULTRA TEST Use to check blood sugar 7  times per day   insulin lispro 100 UNIT/ML injection Commonly known as:  HUMALOG Use in insulin pump, 60 UNITS DAILY   ONE TOUCH ULTRA 2 w/Device Kit Use to check blood sugar 7 times per day E10.65   simvastatin 40 MG tablet Commonly known as:  ZOCOR Take 1 tablet (40 mg total) by mouth at  bedtime.   SYNTHROID 100 MCG tablet Generic drug:  levothyroxine Take 1 tablet (100 mcg total) by mouth daily before breakfast.       Allergies:  Allergies  Allergen Reactions  . Atorvastatin     REACTION: joint pains  . Influenza Vaccines     Patient got bursitis after her vaccine  . Losartan Potassium     REACTION: blurred vision  . Metoprolol Succinate     REACTION: trigger fingers, arm pain  . Olmesartan Medoxomil   . Quinapril Hcl     REACTION: throat swelling    Past Medical History:  Diagnosis Date  . ANXIETY 10/28/2007  . BACK PAIN 12/23/2007  . CAROTID BRUIT 07/28/2009  . CORONARY ARTERY DISEASE 07/22/2007  . DIABETES MELLITUS, TYPE I 07/22/2007  . GERD 12/23/2007  . HAIR LOSS 02/17/2009  . HYPERLIPIDEMIA 07/22/2007  . HYPERTENSION 07/22/2007  . HYPOTHYROIDISM 07/22/2007  . SKIN LESION 10/27/2007  . URI 12/23/2007    Past Surgical History:  Procedure Laterality Date  . CESAREAN SECTION  x2  . CORONARY ARTERY BYPASS GRAFT    . s/p coronary stent    . TUBAL LIGATION      Family History  Problem Relation Age of Onset  . Hypertension Mother   . Melanoma Mother   . Alcohol abuse Sister   . Arthritis Maternal Grandmother   . Diabetes Maternal Grandmother   . Heart disease Neg Hx     Social History:  reports that she has never smoked. She has never used smokeless tobacco. She reports that she does not drink alcohol or use drugs.  REVIEW of systems:  Eye exam last  in 3/17 with Dr. Zadie Rhine , has retinopathy, Stable.   HYPOTHYROIDISM: She has had long-standing primary hypothyroidism and previously has been on either Armour Thyroid or levothyroxine. She thinks she did not tolerate Armour Thyroid.  She has been taking 100 g , previously 88 mcg   With her increasing the dose in April her energy levelIs improved  With taking her medication in the morning she would tend to forget and is taking this at night and is consistent with this  TSH is  normal  Recently:   Lab Results  Component Value Date   TSH 0.88 09/23/2016   TSH 0.70 05/10/2016   TSH 3.31 08/16/2015   FREET4 1.30 09/23/2016   FREET4 0.96 08/16/2015   FREET4 0.89 05/08/2015     ?  Hypertension She has had borderline or high blood pressure readings but refuses to take any medication Previously she had agreed to take a small dose of Diovan especially to prevent retinopathy and nephropathy but she did not start thisEven though she had a prescription No microalbuminuria  Home BP is 150 + but has not seen PCP recently   Hyperlipidemia:  On several of her previous visits she has agreed to restart taking Zocor but still has not done this even though it has been explained to her repeatedly why she needs to do this She does have the prescription at home but she says she has not been able to remember to do this    Her lipids are not controlled and LDL is still over 100 although relatively better than the last time    Lab Results  Component Value Date   CHOL 201 (H) 09/23/2016   HDL 68.10 09/23/2016   LDLCALC 115 (H) 09/23/2016   LDLDIRECT 137.3 02/24/2013   TRIG 90.0 09/23/2016   CHOLHDL 3 09/23/2016     Diabetic foot exam: shows normal monofilament sensation in the toes and plantar surfaces, no skin lesions or ulcers on the feet and normal pedal pulses  EXAM:  BP (!) 160/84   Pulse 87   Ht 5' 1"  (1.549 m)   Wt 159 lb 3.2 oz (72.2 kg)   SpO2 97%   BMI 30.08 kg/m    ASSESSMENT:  DIABETES type 1:  See history of present illness for detailed discussion of current management, blood sugar patterns and problems identified  Her blood sugars are somewhat better than on her last visit and not having as much hyperglycemia Postprandial readings are better with adjusting her carbohydrate coverage previously and also active insulin time  However she appears to have a significant Dawn phenomenon with blood sugar rising within 30 minutes of her waking up Despite  recommendations she is does not bolus Sufficiently or none at all at that time even when drinking coffee Postprandial readings are fairly well controlled However she may tend to have relatively low sugars after evening  meal from overestimating how much she needs Also does not have any blood sugars below 70 currently on her sensor   Recommendations:  She needs to bolus For her Dawn phenomenon more consistently on waking up, the bolus amount to be variable based on the actual blood sugar level and the sensor trend  Start eating a balanced meals with protein like eggs and toast instead of cereal in the morning  Do not add extra to the boluses for evening meal and use the pump recommendations for boluses  If she still has low normal readings after her evening bolus she will reduce her carbohydrate coverage to 1: 12  Start walking in the morning Follow-up A1c in 3 months  HYPERTENSION: She now appears to have high blood pressure readings at home also She is going to make an appointment with her PCP to follow-up and also discussed other issues Most likely she should be started on antihypertensives   HYPOTHYROIDISM:  She will continue same dosage as her TSH is quite normal now  Hyperlipidemia:  She does need to start simvastatin and discussed again benefits of cardiovascular protection impatience with diabetes She thinks she will start this now although she usually does not follow up on her promises  Counseling time on subjects discussed in assessment and plan sections is over 50% of today's 25 minute visit   There are no Patient Instructions on file for this visit.   Aniyla Harling 09/27/2016, 10:40 AM

## 2016-09-28 ENCOUNTER — Telehealth: Payer: Self-pay | Admitting: Endocrinology

## 2016-09-30 NOTE — Telephone Encounter (Signed)
Please advise if okay to refill. 

## 2016-09-30 NOTE — Telephone Encounter (Signed)
ok 

## 2016-10-04 ENCOUNTER — Ambulatory Visit (INDEPENDENT_AMBULATORY_CARE_PROVIDER_SITE_OTHER): Payer: Commercial Managed Care - PPO | Admitting: Internal Medicine

## 2016-10-04 ENCOUNTER — Encounter: Payer: Self-pay | Admitting: Internal Medicine

## 2016-10-04 DIAGNOSIS — I10 Essential (primary) hypertension: Secondary | ICD-10-CM

## 2016-10-04 DIAGNOSIS — R600 Localized edema: Secondary | ICD-10-CM | POA: Diagnosis not present

## 2016-10-04 DIAGNOSIS — M5431 Sciatica, right side: Secondary | ICD-10-CM

## 2016-10-04 DIAGNOSIS — M5430 Sciatica, unspecified side: Secondary | ICD-10-CM | POA: Insufficient documentation

## 2016-10-04 MED ORDER — PREDNISONE 10 MG PO TABS: ORAL_TABLET | ORAL | 0 refills | Status: DC

## 2016-10-04 NOTE — Assessment & Plan Note (Signed)
stable overall by history and exam, recent data reviewed with pt, and pt to continue medical treatment as before,  to f/u any worsening symptoms or concerns BP Readings from Last 3 Encounters:  10/04/16 124/76  09/27/16 (!) 160/84  06/27/16 (!) 181/81

## 2016-10-04 NOTE — Progress Notes (Signed)
Subjective:    Patient ID: Stacy Cain, female    DOB: 1945-09-27, 71 y.o.   MRN: [id number]  HPI here with 2-3 mo onset worsening pain to the right leg starting at the right buttock and lateral hip area with radation to the distal RLE, Denies "back pain" and bowel or bladder change, fever, wt loss,  worsening LE pain/numbness/weakness, gait change or falls. No prior hx, read about sciatica on the internet but finally came in when pain seemed worse today.  Pt denies chest pain, increased sob or doe, wheezing, orthopnea, PND, palpitations, dizziness or syncope, though has > 1 mo increased mild right > left swelling to feet that gets better at night.   Pt denies fever, wt loss, night sweats, loss of appetite, or other constitutional symptoms Past Medical History:  Diagnosis Date  . ANXIETY 10/28/2007  . BACK PAIN 12/23/2007  . CAROTID BRUIT 07/28/2009  . CORONARY ARTERY DISEASE 07/22/2007  . DIABETES MELLITUS, TYPE I 07/22/2007  . GERD 12/23/2007  . HAIR LOSS 02/17/2009  . HYPERLIPIDEMIA 07/22/2007  . HYPERTENSION 07/22/2007  . HYPOTHYROIDISM 07/22/2007  . SKIN LESION 10/27/2007  . URI 12/23/2007   Past Surgical History:  Procedure Laterality Date  . CESAREAN SECTION     x2  . CORONARY ARTERY BYPASS GRAFT    . s/p coronary stent    . TUBAL LIGATION      reports that she has never smoked. She has never used smokeless tobacco. She reports that she does not drink alcohol or use drugs. family history includes Alcohol abuse in her sister; Arthritis in her maternal grandmother; Diabetes in her maternal grandmother; Hypertension in her mother; Melanoma in her mother. Allergies  Allergen Reactions  . Atorvastatin     REACTION: joint pains  . Influenza Vaccines     Patient got bursitis after her vaccine  . Losartan Potassium     REACTION: blurred vision  . Metoprolol Succinate     REACTION: trigger fingers, arm pain  . Olmesartan Medoxomil   . Quinapril Hcl     REACTION: throat swelling    Current Outpatient Prescriptions on File Prior to Visit  Medication Sig Dispense Refill  . Blood Glucose Monitoring Suppl (ONE TOUCH ULTRA 2) w/Device KIT Use to check blood sugar 7 times per day E10.65 1 each 0  . glucose blood (ONE TOUCH ULTRA TEST) test strip Use to check blood sugar 7  times per day 700 each 2  . insulin lispro (HUMALOG) 100 UNIT/ML injection Use in insulin pump, 60 UNITS DAILY 60 mL 2  . levothyroxine (SYNTHROID, LEVOTHROID) 100 MCG tablet TAKE 1 TABLET(100 MCG) BY MOUTH DAILY 30 tablet 0  . simvastatin (ZOCOR) 40 MG tablet Take 1 tablet (40 mg total) by mouth at bedtime. 90 tablet 3  . SYNTHROID 100 MCG tablet Take 1 tablet (100 mcg total) by mouth daily before breakfast. 30 tablet 3   No current facility-administered medications on file prior to visit.    Review of Systems  Constitutional: Negative for other unusual diaphoresis or sweats HENT: Negative for ear discharge or swelling Eyes: Negative for other worsening visual disturbances Respiratory: Negative for stridor or other swelling  Gastrointestinal: Negative for worsening distension or other blood Genitourinary: Negative for retention or other urinary change Musculoskeletal: Negative for other MSK pain or swelling Skin: Negative for color change or other new lesions Neurological: Negative for worsening tremors and other numbness  Psychiatric/Behavioral: Negative for worsening agitation or other fatigue All other system neg  per pt    Objective:   Physical Exam BP 124/76   Pulse 90   Temp 98.7 F (37.1 C) (Oral)   Ht 5' 1"  (1.549 m)   Wt 158 lb (71.7 kg)   SpO2 98%   BMI 29.85 kg/m  VS noted, not ill appearing Constitutional: Pt appears in NAD HENT: Head: NCAT.  Right Ear: External ear normal.  Left Ear: External ear normal.  Eyes: . Pupils are equal, round, and reactive to light. Conjunctivae and EOM are normal Nose: without d/c or deformity Neck: Neck supple. Gross normal  ROM Cardiovascular: Normal rate and regular rhythm.   Spine nontender Right lateral hip and leg nontender Pulmonary/Chest: Effort normal and breath sounds without rales or wheezing.  Abd:  Soft, NT, ND, + BS, no organomegaly Neurological: Pt is alert. At baseline orientation, motor grossly intact Skin: Skin is warm. No rashes, other new lesions, has trace right > left pedal LE edema Psychiatric: Pt behavior is normal without agitation  No other exam findings     Assessment & Plan:

## 2016-10-04 NOTE — Assessment & Plan Note (Signed)
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

## 2016-10-04 NOTE — Assessment & Plan Note (Signed)
New onset last few months, very mild, etiology unclear but may be venous insufficiency, pt declines need for further eval or tx

## 2016-10-04 NOTE — Patient Instructions (Signed)
Please take all new medication as prescribed - the prednisone  Please continue all other medications as before, including the advil as needed for pain  Please see Sports Medicine in this office if not improved in 1-2 wks  Please have the pharmacy call with any other refills you may need.  Please keep your appointments with your specialists as you may have planned

## 2016-10-09 NOTE — Telephone Encounter (Signed)
She has the brand name Synthroid, has the prescription not been received by the pharmacy on 8/27?

## 2016-10-09 NOTE — Telephone Encounter (Signed)
This is an old note. She must have received this since I do not see an updated note.

## 2016-10-14 ENCOUNTER — Encounter: Payer: Self-pay | Admitting: Family Medicine

## 2016-10-14 ENCOUNTER — Ambulatory Visit (INDEPENDENT_AMBULATORY_CARE_PROVIDER_SITE_OTHER): Payer: Commercial Managed Care - PPO | Admitting: Family Medicine

## 2016-10-14 DIAGNOSIS — M5431 Sciatica, right side: Secondary | ICD-10-CM

## 2016-10-14 MED ORDER — ACYCLOVIR 800 MG PO TABS: 800.0000 mg | ORAL_TABLET | Freq: Two times a day (BID) | ORAL | 0 refills | Status: DC

## 2016-10-14 NOTE — Patient Instructions (Signed)
Thank you for coming in,   Please try the exercises.   Please try some over the counter orthotics to help with your foot structure.    Please feel free to call with any questions or concerns at any time, at [phone]. --Dr. Jordan LikesSchmitz

## 2016-10-14 NOTE — Progress Notes (Signed)
Stacy Cain - 71 y.o. female MRN [id number]  Date of birth: 10-09-1945  SUBJECTIVE:  Including CC & ROS.  Chief Complaint  Patient presents with  . Leg Pain    Patient is here today to F/U with RLE pain.  She did not get the Prednisone filled as it causes her blood sugar to spike.  She states that the pain is slightly better over the last few days but still has some swelling in the ankle.    [name] is a 18-year-old female that is presenting with right posterior leg pain. This pain has been present since December. She denies any injury or prior surgery to her back or hip. The pain is dull and intermittent in nature. She feels better after she sleeps with a pillow between her legs. She feels has gotten better over the past few days. She takes Advil for the pain which helps. She has a history of type 1 diabetes   She was seen on 8/31 by Dr. Jonny Ruiz for sciatica. No medications were given at that time. Review of her A1c from 8/20 shows 7.5  Review of Systems  Musculoskeletal: Negative for back pain, gait problem and joint swelling.  Skin: Negative for color change.  Neurological: Negative for weakness and numbness.  Hematological: Negative for adenopathy.    HISTORY: Past Medical, Surgical, Social, and Family History Reviewed & Updated per EMR.   Pertinent Historical Findings include:  Past Medical History:  Diagnosis Date  . ANXIETY 10/28/2007  . BACK PAIN 12/23/2007  . CAROTID BRUIT 07/28/2009  . CORONARY ARTERY DISEASE 07/22/2007  . DIABETES MELLITUS, TYPE I 07/22/2007  . GERD 12/23/2007  . HAIR LOSS 02/17/2009  . HYPERLIPIDEMIA 07/22/2007  . HYPERTENSION 07/22/2007  . HYPOTHYROIDISM 07/22/2007  . SKIN LESION 10/27/2007  . URI 12/23/2007    Past Surgical History:  Procedure Laterality Date  . CESAREAN SECTION     x2  . CORONARY ARTERY BYPASS GRAFT    . s/p coronary stent    . TUBAL LIGATION      Allergies  Allergen Reactions  . Atorvastatin     REACTION: joint pains  .  Influenza Vaccines     Patient got bursitis after her vaccine  . Losartan Potassium     REACTION: blurred vision  . Metoprolol Succinate     REACTION: trigger fingers, arm pain  . Olmesartan Medoxomil   . Quinapril Hcl     REACTION: throat swelling    Family History  Problem Relation Age of Onset  . Hypertension Mother   . Melanoma Mother   . Alcohol abuse Sister   . Arthritis Maternal Grandmother   . Diabetes Maternal Grandmother   . Heart disease Neg Hx      Social History   Social History  . Marital status: Married    Spouse name: N/A  . Number of children: 2  . Years of education: N/A   Occupational History  . 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 Smoking status: Never Smoker  . Smokeless tobacco: Never Used  . Alcohol use No  . Drug use: No  . Sexual activity: Not on file   Other Topics Concern  . Not on file   Social History Narrative  . No narrative on file     PHYSICAL EXAM:  VS: BP 138/78 (BP Location: Left Arm, Patient Position: Sitting, Cuff Size: Normal)   Pulse 80   Temp 98.8 F (37.1 C) (Oral)  Ht  (1.549 m)   Wt 158 lb (71.7 kg)   SpO2 96%   BMI 29.85 kg/m  Physical Exam Gen: NAD, alert, cooperative with exam, well-appearing ENT: normal lips, normal nasal mucosa,  Eye: normal EOM, normal conjunctiva and lids CV:  no edema, +2 pedal pulses   Resp: no accessory muscle use, non-labored,  Skin: no rashes, no areas of induration  Neuro: normal tone, normal sensation to touch Psych:  normal insight, alert and oriented MSK:  Back/hip: No tenderness to palpation over the lumbar spine, paraspinal muscles, SI joints, piriformis, or greater trochanter. Normal hip internal and external range of motion. Normal hip flexion strength. Normal knee extension and flexion. Loss of the transverse arch was splaying between the second third digit on each foot. Some more pronation evident on the left compared  to the right foot. Negative FADIR and FABER.  Normal back flexion  No pain with crossover leg test. Neurovascularly intact      ASSESSMENT & PLAN:   I spent 25 minutes with this patient, greater than 50% was face-to-face time counseling regarding the below diagnosis.   Sciatica Pain seems to be more consistent with a piriformis or a nerve entrapment peripherally instead of in the back. Has had improvement recently. She is a type I diabetic so would avoid prednisone. - Home exercises provided. If not improving gang consider physical therapy - Can continue Advil as needed. - Follow-up if no improvement may consider an ultrasound with possible nerve artery dissection.

## 2016-10-14 NOTE — Assessment & Plan Note (Addendum)
Pain seems to be more consistent with a piriformis or a nerve entrapment peripherally instead of in the back. Has had improvement recently. She is a type I diabetic so would avoid prednisone. - Home exercises provided. If not improving gang consider physical therapy - Can continue Advil as needed. - Follow-up if no improvement may consider an ultrasound with possible nerve artery dissection.

## 2016-10-26 ENCOUNTER — Other Ambulatory Visit: Payer: Self-pay | Admitting: Endocrinology

## 2016-11-02 ENCOUNTER — Other Ambulatory Visit: Payer: Self-pay | Admitting: Endocrinology

## 2016-12-25 ENCOUNTER — Encounter (INDEPENDENT_AMBULATORY_CARE_PROVIDER_SITE_OTHER): Payer: Self-pay | Admitting: Ophthalmology

## 2016-12-25 ENCOUNTER — Ambulatory Visit (INDEPENDENT_AMBULATORY_CARE_PROVIDER_SITE_OTHER): Payer: Commercial Managed Care - PPO | Admitting: Ophthalmology

## 2016-12-25 DIAGNOSIS — H43813 Vitreous degeneration, bilateral: Secondary | ICD-10-CM | POA: Diagnosis not present

## 2016-12-25 DIAGNOSIS — E103392 Type 1 diabetes mellitus with moderate nonproliferative diabetic retinopathy without macular edema, left eye: Secondary | ICD-10-CM

## 2016-12-25 DIAGNOSIS — Z961 Presence of intraocular lens: Secondary | ICD-10-CM

## 2016-12-25 DIAGNOSIS — H3581 Retinal edema: Secondary | ICD-10-CM

## 2016-12-25 DIAGNOSIS — H35033 Hypertensive retinopathy, bilateral: Secondary | ICD-10-CM | POA: Diagnosis not present

## 2016-12-25 DIAGNOSIS — E103591 Type 1 diabetes mellitus with proliferative diabetic retinopathy without macular edema, right eye: Secondary | ICD-10-CM

## 2016-12-25 DIAGNOSIS — H4312 Vitreous hemorrhage, left eye: Secondary | ICD-10-CM | POA: Diagnosis not present

## 2016-12-25 NOTE — Progress Notes (Signed)
[facility] Note  12/25/2016     CHIEF COMPLAINT Patient presents for Retina Evaluation and Diabetic Eye Exam  20/30 HISTORY OF PRESENT ILLNESS: Stacy Cain is a 71 y.o. female who presents to the clinic today for:   HPI    Retina Evaluation    In both eyes.  This started 1 week ago.  Associated Symptoms Floaters.  Negative for Blind Spot, Distortion, Flashes, Pain, Trauma, Fever, Weight Loss, Scalp Tenderness, Redness, Photophobia, Jaw Claudication, Fatigue, Shoulder/Hip pain and Glare.  Context:  distance vision, mid-range vision and near vision.  Treatments tried include eye drops, laser and surgery.  Response to treatment was no improvement.  I, the attending physician,  performed the HPI with the patient and updated documentation appropriately.          Diabetic Eye Exam    Vision fluctuates with blood sugars.  Associated Symptoms Floaters.  Negative for Blind Spot, Glare, Shoulder/Hip pain, Fatigue, Jaw Claudication, Photophobia, Distortion, Redness, Scalp Tenderness, Weight Loss, Flashes, Pain, Trauma and Fever.  Diabetes characteristics include Type 1 and on insulin.  This started 44 years ago.  Blood sugar level fluctuates.  Last Blood Glucose 131.  Last A1C 7.  Associated Diagnosis Neuropathy.  I, the attending physician,  performed the HPI with the patient and updated documentation appropriately.          Comments    Referral of Dr. Katy Fitch for eval. Vitreous hemorrhage. Patient states about a week ago she saw black spots in left eye . Pt reports she had  laser sx OU years ago (not Sure date) by DR. Rankin. Patient reports she had  cataract sx OD  07/2016  OS 08/2016 By DR. Groat . Denies pain, glare and flashes. BS 131( this am)   A1C 7.0( three months ago .) She reports her vision fluctuates with her Bs . She uses refresh Gtts  PRN .Denies vits        Last edited by Bernarda Caffey, MD on 12/25/2016 11:15 AM. (History)    Pt was seen by Dr. Elliot Dally today as an emergency; Pt states that she is having floaters OS x 1 wk, states that they are in upper temporal quadrant; Reports that she noticed floaters when she woke up; Pt states that she is a controlled diabetic; Pt states that she is seen by Dr. Zadie Rhine and has had lasers done by him; Pt states that she had cataract sx by Dr. Elliot Dally in June and July; Pt endoses that she has been having elevated BP recently;   Referring physician: Clent Jacks, MD [address] [place], [place] [address]  HISTORICAL INFORMATION:   Selected notes from the MEDICAL RECORD NUMBER Referred by Dr. Elliot Dally for concern of VH OS;  LEE- 11.21.18 (R. 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 carboxymethylcellulose (REFRESH PLUS) 0.5 % SOLN 1 drop 3 (three) times daily as needed.   No current facility-administered medications for this visit.  (Ophthalmic Drugs)   Current Outpatient Medications (Other)  Medication Sig  . acyclovir (ZOVIRAX) 800 MG tablet Take 1 tablet (800 mg total) by mouth 2 (two) times daily. For a total of 10 days.  . Blood Glucose Monitoring Suppl (ONE TOUCH ULTRA 2) w/Device KIT Use to check blood sugar  7 times per day E10.65  . glucose blood (ONE TOUCH ULTRA TEST) test strip Use to check blood sugar 7  times per day  . ibuprofen (ADVIL,MOTRIN) 100 MG tablet Take by mouth.  . insulin lispro (HUMALOG) 100 UNIT/ML injection Use in insulin pump, 60 UNITS DAILY  . levothyroxine (SYNTHROID, LEVOTHROID) 100 MCG tablet TAKE 1 TABLET(100 MCG) BY MOUTH DAILY  . levothyroxine (SYNTHROID, LEVOTHROID) 88 MCG tablet Take by mouth.  . predniSONE (DELTASONE) 10 MG tablet 3 tabs by mouth per day for 3 days,2tabs per day for 3 days,1tab per day for 3 days (Patient not taking: Reported on 12/25/2016)  .  simvastatin (ZOCOR) 40 MG tablet Take 1 tablet (40 mg total) by mouth at bedtime. (Patient not taking: Reported on 12/25/2016)   No current facility-administered medications for this visit.  (Other)      REVIEW OF SYSTEMS: ROS    Positive for: Neurological, Skin, Genitourinary, Musculoskeletal, Endocrine, Cardiovascular, Eyes, Respiratory, Allergic/Imm, Heme/Lymph   Negative for: Constitutional, Gastrointestinal, HENT, Psychiatric   Last edited by Zenovia Jordan, LPN on 76/80/8811 03:15 AM. (History)       ALLERGIES Allergies  Allergen Reactions  . Atorvastatin     REACTION: joint pains  . Influenza Vaccines     Patient got bursitis after her vaccine  . Losartan Potassium     REACTION: blurred vision  . Metoprolol Succinate     REACTION: trigger fingers, arm pain  . Olmesartan Medoxomil   . Quinapril Hcl     REACTION: throat swelling    PAST MEDICAL HISTORY Past Medical History:  Diagnosis Date  . ANXIETY 10/28/2007  . BACK PAIN 12/23/2007  . CAROTID BRUIT 07/28/2009  . CORONARY ARTERY DISEASE 07/22/2007  . DIABETES MELLITUS, TYPE I 07/22/2007  . GERD 12/23/2007  . HAIR LOSS 02/17/2009  . HYPERLIPIDEMIA 07/22/2007  . HYPERTENSION 07/22/2007  . HYPOTHYROIDISM 07/22/2007  . SKIN LESION 10/27/2007  . URI 12/23/2007   Past Surgical History:  Procedure Laterality Date  . CATARACT EXTRACTION    . CESAREAN SECTION     x2  . CORONARY ARTERY BYPASS GRAFT    . EYE SURGERY    . s/p coronary stent    . TUBAL LIGATION      FAMILY HISTORY Family History  Problem Relation Age of Onset  . Hypertension Mother   . Melanoma Mother   . Alcohol abuse Sister   . Arthritis Maternal Grandmother   . Diabetes Maternal Grandmother   . Heart disease Neg Hx     SOCIAL HISTORY Social History   Tobacco Use  . Smoking status: Never Smoker  . Smokeless tobacco: Never Used  Substance Use Topics  . Alcohol use: No  . Drug use: No         OPHTHALMIC EXAM:  Base Eye Exam     Visual Acuity (Snellen - Linear)      Right Left   Dist cc 20/30 -1 20/25 -1       Tonometry (Tonopen, 10:19 AM)      Right Left   Pressure 15 15       Pupils      Dark Shape React APD   Right 7 Round NR None   Left 7 Round NR None  Pharm dilated upon arrival        Neuro/Psych    Oriented x3:  Yes   Mood/Affect:  Normal       Dilation    Both eyes:  2.5% Phenylephrine, 1.0% Mydriacyl @  10:58 AM        Slit Lamp and Fundus Exam    External Exam      Right Left   External Normal Normal       Slit Lamp Exam      Right Left   Lids/Lashes Dermatochalasis - upper lid Dermatochalasis - upper lid   Conjunctiva/Sclera White and quiet White and quiet   Cornea 1+ Guttata 2+ Guttata, endo pigment 0600   Anterior Chamber Deep and quiet Deep and quiet   Iris Round and dilated, mild iridodenisis, No NVI Round and dilated, mild iridodenisis, No NVI   Lens Posterior chamber intraocular lens - toric, marks at 0200 and 0800, 1+ PCO with wrinkling Posterior chamber intraocular lens - toric, marks at 0330 and 0930, 1+ PCO with wrinkling    Vitreous Posterior vitreous detachment Posterior vitreous detachment, mild VH inferiorly       Fundus Exam      Right Left   Disc sharp margin, peripapillary atrophy temporally, no NVD Mild Peripapillary atrophy, mild elevation of disc at 0600, No NVD   C/D Ratio 0.3    Macula Flat, temporal drusen, rare MA, no edema Flat, blunted foveal reflex, ERM, temporal drusen, MAs   Vessels Vascular attenuation, Tortuous, mild AV crossing changes Vascular attenuation, Tortuous, mild AV crossing changes   Periphery Attached, scattered PRP - room for fill in attached; no RT or RD on 360 SD exam          IMAGING AND PROCEDURES  Imaging and Procedures for 12/25/16  OCT, Retina - OU - Both Eyes     Right Eye Quality was good. Central Foveal Thickness: 287. Progression has no prior data. Findings include normal foveal contour, no IRF, no SRF, epiretinal  membrane (Nasal ERM with inner retinal corrugations ).   Left Eye Quality was good. Central Foveal Thickness: 357. Progression has no prior data. Findings include epiretinal membrane, abnormal foveal contour, no IRF, no SRF (Trace cystic changes, no SRF).   Notes Images taken, stored on drive  Diagnosis / Impression: No DME OU OD: nasal ERM, No IRF, No SRF OS: ERM with central thickening, No IRF, No SRF, trace cystic changes  Clinical management:  See below  Abbreviations: NFP - Normal foveal profile. CME - cystoid macular edema. PED - pigment epithelial detachment. IRF - intraretinal fluid. SRF - subretinal fluid. EZ - ellipsoid zone. ERM - epiretinal membrane. ORA - outer retinal atrophy. ORT - outer retinal tubulation. SRHM - subretinal hyper-reflective material         Fluorescein Angiography Optos (Transit OS)     Right Eye Progression has no prior data. Early phase findings include window defect, vascular perfusion defect, microaneurysm (ERMA). Mid/Late phase findings include vascular perfusion defect, microaneurysm, window defect.   Left Eye Progression has no prior data. Early phase findings include microaneurysm, vascular perfusion defect, blockage. Mid/Late phase findings include microaneurysm, vascular perfusion defect, blockage (No NVE).   Notes OD: microaneurysms; capillary nonperfusion; IRMA inf temp macula; PRP scars OS: microaneurysms; capillary nonperfusion; no NV                ASSESSMENT/PLAN:    ICD-10-CM   1. Vitreous hemorrhage of left eye (HCC) H43.12 Fluorescein Angiography Optos (Transit OS)  2. 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  Hypertensive retinopathy of both eyes H35.033   5. PVD (posterior vitreous detachment), bilateral H43.813   6. Retinal edema H35.81 OCT, Retina - OU - Both Eyes  7. Pseudophakia of both eyes Z96.1     1. Vitreous hemorrhage OS - mild vitreous hemorrhage, onset 1 wk ago, already settling inferiorly - likely secondary to DM1 + HTN - no RT noted on scleral depressed exam 360 - VH precautions reviewed -- minimize activities, keep head elevated, avoid ASA/NSAIDs/blood thinners as able  2, 3. Diabetic retinopathy secondary to Type I DM - BG well controlled, but BP has been running high - The incidence, risk factors for progression, natural history and treatment options for diabetic retinopathy were discussed with patient.   - The need for close monitoring of blood glucose, blood pressure, and serum lipids, avoiding cigarette or any type of tobacco, and the need for long term follow up was also discussed with patient. - OD: PDR s/p PRP laser with Rankin -- FA with IRMA inf temporal macular; recommend fill in PRP - OS: no NVE on FA, but does have VH -- recommend PRP OS  Pt wishes return in 1 wk for laser  Plan for laser PRP OS, then PRP fill in OD 2 wks after OS  4. Hypertensive retinopathy OU - discussed importance of tight BP control - likely contributing to #2,3 - monitor  5. PVD / vitreous syneresis OU  Discussed findings and prognosis  No RT or RD on 360 scleral depressed exam  Reviewed s/s of RT/RD  Strict return precautions for any such RT/RD signs/symptoms  6. No retinal edema  7. Pseudophakia OU  - s/p CE/IOL w/ Dr. Elliot Dally  - beautiful surgery, doing well  - monitor  Ophthalmic Meds Ordered this visit:  No orders of the defined types were placed in this encounter.     Return in about 2 weeks (around 01/08/2017) for Dilated Exam.  There are no Patient Instructions on file for this visit.   Explained the diagnoses, plan, and follow up with the patient and they  expressed understanding.  Patient expressed understanding of the importance of proper follow up care.   Gardiner Sleeper, M.D., Ph.D. Diseases & Surgery of the Retina and Vitreous [facility] 12/25/16     Abbreviations: M myopia (nearsighted); A astigmatism; H hyperopia (farsighted); P presbyopia; Mrx spectacle prescription;  CTL contact lenses; OD right eye; OS left eye; OU both eyes  XT exotropia; ET esotropia; PEK punctate epithelial keratitis; PEE punctate epithelial erosions; DES dry eye syndrome; MGD meibomian gland dysfunction; ATs artificial tears; PFAT's preservative free artificial tears; [place] nuclear sclerotic cataract; PSC posterior subcapsular cataract; ERM epi-retinal membrane; PVD posterior vitreous detachment; RD retinal detachment; DM diabetes mellitus; DR diabetic retinopathy; NPDR non-proliferative diabetic retinopathy; PDR proliferative diabetic retinopathy; CSME clinically significant macular edema; DME diabetic macular edema; dbh dot blot hemorrhages; CWS cotton wool spot; POAG primary open angle glaucoma; C/D cup-to-disc ratio; HVF humphrey visual field; GVF goldmann visual field; OCT optical coherence tomography; IOP intraocular pressure; BRVO Branch retinal vein occlusion; CRVO central retinal vein occlusion; CRAO central retinal artery occlusion; BRAO branch retinal artery occlusion; RT retinal tear; SB scleral buckle; PPV pars plana vitrectomy; VH Vitreous hemorrhage; PRP panretinal laser photocoagulation; IVK intravitreal kenalog; VMT vitreomacular traction; MH Macular hole;  NVD neovascularization of the disc; NVE neovascularization elsewhere; AREDS age related eye disease study; ARMD age related macular degeneration;  POAG primary open angle glaucoma; EBMD epithelial/anterior basement membrane dystrophy; ACIOL anterior chamber intraocular lens; IOL intraocular lens; PCIOL posterior chamber intraocular lens; Phaco/IOL phacoemulsification with intraocular  lens placement; [place] photorefractive keratectomy; LASIK laser assisted in situ keratomileusis; HTN hypertension; DM diabetes mellitus; COPD chronic obstructive pulmonary disease

## 2016-12-30 ENCOUNTER — Other Ambulatory Visit (INDEPENDENT_AMBULATORY_CARE_PROVIDER_SITE_OTHER): Payer: Commercial Managed Care - PPO

## 2016-12-30 DIAGNOSIS — E1065 Type 1 diabetes mellitus with hyperglycemia: Secondary | ICD-10-CM | POA: Diagnosis not present

## 2016-12-30 LAB — COMPREHENSIVE METABOLIC PANEL
ALBUMIN: 3.9 g/dL (ref 3.5–5.2)
ALK PHOS: 73 U/L (ref 39–117)
ALT: 10 U/L (ref 0–35)
AST: 14 U/L (ref 0–37)
BUN: 14 mg/dL (ref 6–23)
CHLORIDE: 103 meq/L (ref 96–112)
CO2: 29 mEq/L (ref 19–32)
Calcium: 9.5 mg/dL (ref 8.4–10.5)
Creatinine, Ser: 0.85 mg/dL (ref 0.40–1.20)
GFR: 70.05 mL/min (ref >60.00)
GLUCOSE: 166 mg/dL — AB (ref 70–99)
POTASSIUM: 5 meq/L (ref 3.5–5.1)
SODIUM: 138 meq/L (ref 135–145)
TOTAL PROTEIN: 7.1 g/dL (ref 6.0–8.3)
Total Bilirubin: 0.5 mg/dL (ref 0.2–1.2)

## 2016-12-30 LAB — LIPID PANEL
CHOLESTEROL: 219 mg/dL — AB (ref 0–200)
HDL: 67.7 mg/dL (ref >39.00)
LDL CALC: 136 mg/dL — AB (ref 0–99)
NONHDL: 151.45
Total CHOL/HDL Ratio: 3
Triglycerides: 78 mg/dL (ref 0.0–149.0)
VLDL: 15.6 mg/dL (ref 0.0–40.0)

## 2016-12-30 LAB — HEMOGLOBIN A1C: HEMOGLOBIN A1C: 7.5 % — AB (ref 4.6–6.5)

## 2017-01-01 ENCOUNTER — Encounter (INDEPENDENT_AMBULATORY_CARE_PROVIDER_SITE_OTHER): Payer: Commercial Managed Care - PPO | Admitting: Ophthalmology

## 2017-01-02 NOTE — Progress Notes (Signed)
Patient ID: Stacy Cain, female   DOB: 13-May-1945, 71 y.o.   MRN: [id number]   Reason for Appointment:  followup:   History of Present Illness   Diagnosis: Type 1 DIABETES MELITUS, date of diagnosis:  1974  DIABETES history: She has been on an insulin pump for several years with variable control. She is fairly compliant with monitoring her blood sugars and doing some adjustments of her pump settings but continues to have variability in blood sugar  Her A1c has previously been stable between 7-7.4%  CURRENT insulin pump:  [employer]onic 670  RECENT HISTORY:   PUMP SETTINGS are: Basal rate: Midnight = 0.6; 7 AM = 1.5,  2 PM = 1.0, 6 PM = 0.975, 10 PM = 0.8 Carb Ratio: 1: 9 breakfast, 1: 12, 1:10 at 5 PM   Correction factor 1: 50 at 6 AM and 45 at 12 noon; 55 overnight, target 110, active insulin 3-1/2 hours  Her A1c has been relatively higher this year at 7.5 again  She has has been on the 670 pump and in the auto mode since 04/10/16  Current blood sugar data and problems from pump download and analysis of her sensor recordings:   She has an average blood sugar of 162 on her sensor compared to 155 before and standard deviation 59  Has been in the auto mode 89 % of the time  She is in the target range 65 % of the time compared to previous 69 % of the time  Patterns:  FASTING blood sugars tend to be rising and trending higher even before she has breakfast  Although she was told to take a bolus for her coffee in the morning she does not always do so and usually tends to have continued rise in blood sugars until early afternoon  Again she is sometimes eating cereal in the morning and her blood sugar may spike up after breakfast  POSTPRANDIAL readings are again variable but usually not as consistently high after evening meal  Blood sugars before and after meals mostly documented at lunch and supper as follows:  Before and after lunch 179, 203  Before and after dinner 173  and 163  VARIABILITY is most consistent on the afternoons around 3-5 PM and not clear why blood sugars are inconsistent  Although she thinks she is bolusing BEFORE eating it appears that sometime she may be bolusing late  Stacy Cain is having issues with her infusion set  She still has occasional issues with the sensor requiring calibration  Auto mode EXIT has occurred 3 times with no calibration and also with minimum delivery 3 times  Currently her active insulin time is 3-1/2 hours, usually her blood sugars are not excessively high or low at 2 or 3 hours after her meals    EXERCISE:She is not doing any exercise despite reminders If she is going shopping in a large store she does not have hypoglycemia which she did before she went on the auto mode      Wt Readings from Last 3 Encounters:  01/03/17 161 lb 6.4 oz (73.2 kg)  10/14/16 158 lb (71.7 kg)  10/04/16 158 lb (71.7 kg)     Lab Results  Component Value Date   HGBA1C 7.5 (H) 12/30/2016   HGBA1C 7.5 (H) 09/23/2016   HGBA1C 7.4 (H) 05/10/2016   Lab Results  Component Value Date   MICROALBUR 0.8 05/10/2016   LDLCALC 136 (H) 12/30/2016   CREATININE 0.85 12/30/2016    OTHER  active problems: See review of systems  Lab on 12/30/2016  Component Date Value Ref Range Status  . Cholesterol 12/30/2016 219* 0 - 200 mg/dL Final   ATP III Classification       Desirable:  < 200 mg/dL               Borderline High:  200 - 239 mg/dL          High:  > = 086240 mg/dL  . Triglycerides 12/30/2016 78.0  0.0 - 149.0 mg/dL Final   Normal:  <578<150 mg/dLBorderline High:  150 - 199 mg/dL  . HDL 12/30/2016 67.70  >39.00 mg/dL Final  . VLDL 46/96/295211/26/2018 15.6  0.0 - 40.0 mg/dL Final  . LDL Cholesterol 12/30/2016 136* 0 - 99 mg/dL Final  . Total CHOL/HDL Ratio 12/30/2016 3   Final                  Men          Women1/2 Average Risk     3.4          3.3Average Risk          5.0          4.42X Average Risk          9.6          7.13X Average Risk           15.0          11.0                      . NonHDL 12/30/2016 151.45   Final   NOTE:  Non-HDL goal should be 30 mg/dL higher than patient's LDL goal (i.e. LDL goal of < 70 mg/dL, would have non-HDL goal of < 100 mg/dL)  . Sodium 12/30/2016 138  135 - 145 mEq/L Final  . Potassium 12/30/2016 5.0  3.5 - 5.1 mEq/L Final  . Chloride 12/30/2016 103  96 - 112 mEq/L Final  . CO2 12/30/2016 29  19 - 32 mEq/L Final  . Glucose, Bld 12/30/2016 166* 70 - 99 mg/dL Final  . BUN 84/13/244011/26/2018 14  6 - 23 mg/dL Final  . Creatinine, Ser 12/30/2016 0.85  0.40 - 1.20 mg/dL Final  . Total Bilirubin 12/30/2016 0.5  0.2 - 1.2 mg/dL Final  . Alkaline Phosphatase 12/30/2016 73  39 - 117 U/L Final  . AST 12/30/2016 14  0 - 37 U/L Final  . ALT 12/30/2016 10  0 - 35 U/L Final  . Total Protein 12/30/2016 7.1  6.0 - 8.3 g/dL Final  . Albumin 10/27/253611/26/2018 3.9  3.5 - 5.2 g/dL Final  . Calcium 64/40/347411/26/2018 9.5  8.4 - 10.5 mg/dL Final  . GFR 25/95/638711/26/2018 70.05  >60.00 mL/min Final  . Hgb A1c MFr Bld 12/30/2016 7.5* 4.6 - 6.5 % Final   Glycemic Control Guidelines for People with Diabetes:Non Diabetic:  <6%Goal of Therapy: <7%Additional Action Suggested:  >8%     Allergies as of 01/03/2017      Reactions   Atorvastatin    REACTION: joint pains   Influenza Vaccines    Patient got bursitis after her vaccine   Losartan Potassium    REACTION: blurred vision   Metoprolol Succinate    REACTION: trigger fingers, arm pain   Olmesartan Medoxomil    Quinapril Hcl    REACTION: throat swelling      Medication List        Accurate as  of 01/03/17 10:23 AM. Always use your most recent med list.          insulin lispro 100 UNIT/ML injection Commonly known as:  HUMALOG Use in insulin pump, 60 UNITS DAILY   levothyroxine 100 MCG tablet Commonly known as:  SYNTHROID, LEVOTHROID TAKE 1 TABLET(100 MCG) BY MOUTH DAILY       Allergies:  Allergies  Allergen Reactions  . Atorvastatin     REACTION: joint pains  . Influenza  Vaccines     Patient got bursitis after her vaccine  . Losartan Potassium     REACTION: blurred vision  . Metoprolol Succinate     REACTION: trigger fingers, arm pain  . Olmesartan Medoxomil   . Quinapril Hcl     REACTION: throat swelling    Past Medical History:  Diagnosis Date  . ANXIETY 10/28/2007  . BACK PAIN 12/23/2007  . CAROTID BRUIT 07/28/2009  . CORONARY ARTERY DISEASE 07/22/2007  . DIABETES MELLITUS, TYPE I 07/22/2007  . GERD 12/23/2007  . HAIR LOSS 02/17/2009  . HYPERLIPIDEMIA 07/22/2007  . HYPERTENSION 07/22/2007  . HYPOTHYROIDISM 07/22/2007  . SKIN LESION 10/27/2007  . URI 12/23/2007    Past Surgical History:  Procedure Laterality Date  . CATARACT EXTRACTION    . CESAREAN SECTION     x2  . CORONARY ARTERY BYPASS GRAFT    . EYE SURGERY    . s/p coronary stent    . TUBAL LIGATION      Family History  Problem Relation Age of Onset  . Hypertension Mother   . Melanoma Mother   . Alcohol abuse Sister   . Arthritis Maternal Grandmother   . Diabetes Maternal Grandmother   . Heart disease Neg Hx     Social History:  reports that  has never smoked. she has never used smokeless tobacco. She reports that she does not drink alcohol or use drugs.  REVIEW of systems:  Eye exam last  in 3/17 with Dr. Luciana Axeankin , has retinopathy, Stable.   HYPOTHYROIDISM: She has had long-standing primary hypothyroidism and previously has been on either Armour Thyroid or levothyroxine. She thinks she did not tolerate Armour Thyroid.  She has been taking 100 g , previously 88 mcg   With her increasing the dose in April her energy levelIs improved  With taking her medication in the morning she would tend to forget and is taking this at night and is consistent with this  TSH is  normal Recently:   Lab Results  Component Value Date   TSH 0.88 09/23/2016   TSH 0.70 05/10/2016   TSH 3.31 08/16/2015   FREET4 1.30 09/23/2016   FREET4 0.96 08/16/2015   FREET4 0.89 05/08/2015     ?   Hypertension She has had borderline or high blood pressure readings but refuses to take any medication  No microalbuminuria Blood pressure has been not as consistently high recently  BP Readings from Last 3 Encounters:  01/03/17 136/84  10/14/16 138/78  10/04/16 124/76      Hyperlipidemia:  On several of her previous visits she has agreed to restart taking Zocor but still has not done this She does have the prescription at home but she says she has not been able to remember to do this and has the same excuse every time    Her lipids are not controlled and LDL is still over 100    Lab Results  Component Value Date   CHOL 219 (H) 12/30/2016   HDL 67.70  12/30/2016   LDLCALC 136 (H) 12/30/2016   LDLDIRECT 137.3 02/24/2013   TRIG 78.0 12/30/2016   CHOLHDL 3 12/30/2016     EXAM:  BP 136/84   Pulse 81   Ht 5\' 1"  (1.549 m)   Wt 161 lb 6.4 oz (73.2 kg)   SpO2 98%   BMI 30.50 kg/m    ASSESSMENT:  DIABETES type 1:  See history of present illness for detailed discussion of current management, blood sugar patterns and problems identified  A1c is still relatively high at 7.5 Most of her high blood sugars are mid day and afternoon although variable She is usually fairly consistent with her day-to-day management However can make changes in her diet and start exercise to help her control Also needs to probably try to cover her various types of meals at breakfast and lunch better and not clear if she would benefit from seeing the dietitian again   Recommendations:  She will try and bolus when blood sugars are going up on waking up and definitely for coffee  Consider improving her breakfast but less carbohydrate in the form of cereals  Needs to consistently bolus before meals  Currently does not need any adjustment of her correction factor but will reassess this again on her next visit  Do not add extra to the boluses for evening meal and use the pump recommendations for  boluses  If she still has low normal readings after her evening bolus she will reduce her carbohydrate coverage to 1: 12  Start walking in the morning, she has been reminded about this on several visits  She will try to continue transmitter for her sensor and requested her to call [employer] to report her problems with calibration  Follow-up A1c in 3 months  Blood pressure: Not consistently high and will continue to monitor Encouraged her to start exercise regimen   HYPOTHYROIDISM:  To have thyroid levels checked again on the next visit  Hyperlipidemia:  She does need to start simvastatin as discussed several times Since she takes her thyroid medication at bedtime she can take both of them together   Counseling time on subjects discussed in assessment and plan sections is over 50% of today's 25 minute visit   Patient Instructions  Take Zocor at nite  Exercise  More protein in am    [facility] 01/03/2017, 10:23 AM

## 2017-01-03 ENCOUNTER — Telehealth: Payer: Self-pay | Admitting: Endocrinology

## 2017-01-03 ENCOUNTER — Ambulatory Visit: Payer: Commercial Managed Care - PPO | Admitting: Endocrinology

## 2017-01-03 ENCOUNTER — Encounter: Payer: Self-pay | Admitting: Endocrinology

## 2017-01-03 VITALS — BP 136/84 | HR 81 | Ht 61.0 in | Wt 161.4 lb

## 2017-01-03 DIAGNOSIS — E063 Autoimmune thyroiditis: Secondary | ICD-10-CM | POA: Diagnosis not present

## 2017-01-03 DIAGNOSIS — E1065 Type 1 diabetes mellitus with hyperglycemia: Secondary | ICD-10-CM

## 2017-01-03 DIAGNOSIS — E782 Mixed hyperlipidemia: Secondary | ICD-10-CM

## 2017-01-03 NOTE — Patient Instructions (Addendum)
Take Zocor at nite  Exercise  More protein in am

## 2017-01-03 NOTE — Telephone Encounter (Signed)
[facility] from [employer] called on the form for certificate of medical necessity, where it say  Frequency sensor change  It is supposed to be a 7,  And please put t the day you change it, date beside it.

## 2017-01-06 NOTE — Telephone Encounter (Signed)
Called patient and let her know that I have resent the fax to [employer] with the correct updated information.

## 2017-01-14 ENCOUNTER — Other Ambulatory Visit: Payer: Self-pay | Admitting: Endocrinology

## 2017-01-18 ENCOUNTER — Other Ambulatory Visit: Payer: Self-pay | Admitting: Endocrinology

## 2017-03-13 ENCOUNTER — Telehealth: Payer: Self-pay | Admitting: Endocrinology

## 2017-03-13 NOTE — Telephone Encounter (Signed)
Sade from [employer] is calling to see if you've received a form for medical of necessity. Please advise   P # [id number] opt # 2  F # [phone]

## 2017-03-13 NOTE — Telephone Encounter (Signed)
I received this paperwork and have placed on Dr. Remus BlakeKumar's desk for signature.

## 2017-03-31 ENCOUNTER — Other Ambulatory Visit (INDEPENDENT_AMBULATORY_CARE_PROVIDER_SITE_OTHER): Payer: Commercial Managed Care - PPO

## 2017-03-31 DIAGNOSIS — E782 Mixed hyperlipidemia: Secondary | ICD-10-CM | POA: Diagnosis not present

## 2017-03-31 DIAGNOSIS — E1065 Type 1 diabetes mellitus with hyperglycemia: Secondary | ICD-10-CM | POA: Diagnosis not present

## 2017-03-31 DIAGNOSIS — E063 Autoimmune thyroiditis: Secondary | ICD-10-CM | POA: Diagnosis not present

## 2017-03-31 LAB — MICROALBUMIN / CREATININE URINE RATIO
CREATININE, U: 161 mg/dL
MICROALB UR: 0.7 mg/dL (ref 0.0–1.9)
MICROALB/CREAT RATIO: 0.4 mg/g (ref 0.0–30.0)

## 2017-03-31 LAB — T4, FREE: Free T4: 0.93 ng/dL (ref 0.60–1.60)

## 2017-03-31 LAB — LIPID PANEL
Cholesterol: 241 mg/dL — ABNORMAL HIGH (ref 0–200)
HDL: 69.4 mg/dL (ref >39.00)
LDL Cholesterol: 159 mg/dL — ABNORMAL HIGH (ref 0–99)
NONHDL: 171.69
Total CHOL/HDL Ratio: 3
Triglycerides: 62 mg/dL (ref 0.0–149.0)
VLDL: 12.4 mg/dL (ref 0.0–40.0)

## 2017-03-31 LAB — COMPREHENSIVE METABOLIC PANEL
ALT: 12 U/L (ref 0–35)
AST: 17 U/L (ref 0–37)
Albumin: 3.9 g/dL (ref 3.5–5.2)
Alkaline Phosphatase: 76 U/L (ref 39–117)
BUN: 14 mg/dL (ref 6–23)
CHLORIDE: 103 meq/L (ref 96–112)
CO2: 27 mEq/L (ref 19–32)
Calcium: 9.6 mg/dL (ref 8.4–10.5)
Creatinine, Ser: 0.9 mg/dL (ref 0.40–1.20)
GFR: 65.53 mL/min (ref >60.00)
GLUCOSE: 159 mg/dL — AB (ref 70–99)
POTASSIUM: 4.5 meq/L (ref 3.5–5.1)
SODIUM: 138 meq/L (ref 135–145)
Total Bilirubin: 0.5 mg/dL (ref 0.2–1.2)
Total Protein: 7 g/dL (ref 6.0–8.3)

## 2017-03-31 LAB — TSH: TSH: 3.71 u[IU]/mL (ref 0.35–4.50)

## 2017-03-31 LAB — HEMOGLOBIN A1C: Hgb A1c MFr Bld: 7.7 % — ABNORMAL HIGH (ref 4.6–6.5)

## 2017-04-02 ENCOUNTER — Ambulatory Visit: Payer: Commercial Managed Care - PPO | Admitting: Endocrinology

## 2017-04-02 ENCOUNTER — Encounter: Payer: Self-pay | Admitting: Endocrinology

## 2017-04-02 VITALS — BP 142/86 | HR 79 | Ht 61.0 in | Wt 163.6 lb

## 2017-04-02 DIAGNOSIS — E1065 Type 1 diabetes mellitus with hyperglycemia: Secondary | ICD-10-CM | POA: Diagnosis not present

## 2017-04-02 DIAGNOSIS — E7800 Pure hypercholesterolemia, unspecified: Secondary | ICD-10-CM | POA: Diagnosis not present

## 2017-04-02 NOTE — Progress Notes (Addendum)
Patient ID: Stacy Cain, female   DOB: [birth year], 72 y.o.   MRN: [id number]   Reason for Appointment:  followup:   History of Present Illness   Diagnosis: Type 1 DIABETES MELITUS, date of diagnosis:  1974  DIABETES history: She has been on an insulin pump for several years with variable control. She is fairly compliant with monitoring her blood sugars and doing some adjustments of her pump settings but continues to have variability in blood sugar  Her A1c has previously been stable between 7-7.4%  CURRENT insulin pump:  Medtronic 670  She has has been on the 670 pump and in the auto mode since 04/10/16   RECENT HISTORY:   PUMP SETTINGS are: Basal rate: Midnight = 0.6; 7 AM = 1.5,  2 PM = 1.0, 6 PM = 0.975, 10 PM = 0.8 Carb Ratio: 1: 9 breakfast, 1: 12, 1:10 at 5 PM   Correction factor 1: 50 at 6 AM and 45 at 12 noon; 55 overnight, target 110, active insulin 3-1/2 hours  Her A1c has been relatively higher and is now 7.7 compared to 7.5 previously  Her settings were not changed on the last visit    Current blood sugar data and problems   She has had HYPERGLYCEMIC patterns occurring midday and in  the afternoon and occasionally late at night  She thinks that her blood sugars are higher when she is waking up and even before her breakfast  Appears that her blood sugars are progressively higher between about 9 AM until 1-2 PM and much higher compared to her previous visit  Some of this may be related to POSTPRANDIAL blood sugar spikes after lunch also which is inconsistent  More recently she is not eating much breakfast or skipping the meal, however blood sugars after morning meal are not consistently high also  AFTER dinner her blood sugars are variable with no consistent pattern and on an average fairly flat  Her diet indicates that she is usually avoiding high fat foods at all meals  Now her active insulin time is 3-1/2 hours, more recently appearing that her blood  sugars are higher for longer period of time after lunch but not dinnertime  FASTING blood sugars are generally excellent when she wakes up and on a couple of occasions mildly decreased also  She has had only occasional HYPOGLYCEMIC episodes, once overnight and this was during the manual mode  RANDOM tend to be rising and trending higher even before she has breakfast  Although she was told to take a bolus for her coffee in the morning she does not always do so and usually tends to have continued rise in blood sugars until early afternoon  Again she is sometimes eating cereal in the morning and her blood sugar may spike up after breakfast  POSTPRANDIAL readings are again variable but usually not as consistently high after evening meal  Blood sugars before and after meals mostly documented at lunch and supper as follows:  Before and after lunch 179, 203  Before and after dinner 173 and 163  VARIABILITY is most consistent on the afternoons around 3-5 PM and not clear why blood sugars are inconsistent  Although she thinks she is bolusing BEFORE eating it appears that sometime she may be bolusing late  Stacy Cain is having issues with her infusion set  She still has occasional issues with the sensor requiring calibration  Auto mode EXIT has occurred 3 times with auto mode max delivery, also another  3 times because of high blood sugar and twice with minimum auto more delivery  Has not had as much problems with difficulty with calibration although she thinks she is doing this only about 2 or 3 times a day  BLOOD sugar and CGM data:  CGM use %  93  Average and SD  165+/-56  Time in range  62  % Time Above 180  27  % Time above 250  9  % Time Below target  2      PRE-MEAL  prebreakfast Lunch Dinner Bedtime Overall  Glucose range:       Mean/median:  156  203  167   165+/-56   POST-MEAL PC Breakfast PC Lunch PC Dinner  Glucose range:     Mean/median:  175  237  168      EXERCISE:She is not doing any exercise despite reminders If she is going shopping in a large store she does not have hypoglycemia which she did before she went on the auto mode      Wt Readings from Last 3 Encounters:  04/02/17 163 lb 9.6 oz (74.2 kg)  01/03/17 161 lb 6.4 oz (73.2 kg)  10/14/16 158 lb (71.7 kg)     Lab Results  Component Value Date   HGBA1C 7.7 (H) 03/31/2017   HGBA1C 7.5 (H) 12/30/2016   HGBA1C 7.5 (H) 09/23/2016   Lab Results  Component Value Date   MICROALBUR 0.7 03/31/2017   LDLCALC 159 (H) 03/31/2017   CREATININE 0.90 03/31/2017    OTHER active problems: See review of systems  Lab on 03/31/2017  Component Date Value Ref Range Status  . Free T4 03/31/2017 0.93  0.60 - 1.60 ng/dL Final   Comment: Specimens from patients who are undergoing biotin therapy and /or ingesting biotin supplements may contain high levels of biotin.  The higher biotin concentration in these specimens interferes with this Free T4 assay.  Specimens that contain high levels  of biotin may cause false high results for this Free T4 assay.  Please interpret results in light of the total clinical presentation of the patient.    Marland Kitchen TSH 03/31/2017 3.71  0.35 - 4.50 uIU/mL Final  . Cholesterol 03/31/2017 241* 0 - 200 mg/dL Final   ATP III Classification       Desirable:  < 200 mg/dL               Borderline High:  200 - 239 mg/dL          High:  > = 161 mg/dL  . Triglycerides 03/31/2017 62.0  0.0 - 149.0 mg/dL Final   Normal:  <096 mg/dLBorderline High:  150 - 199 mg/dL  . HDL 03/31/2017 69.40  >39.00 mg/dL Final  . VLDL 04/54/0981 12.4  0.0 - 40.0 mg/dL Final  . LDL Cholesterol 03/31/2017 159* 0 - 99 mg/dL Final  . Total CHOL/HDL Ratio 03/31/2017 3   Final                  Men          Women1/2 Average Risk     3.4          3.3Average Risk          5.0          4.42X Average Risk          9.6          7.13X Average Risk  15.0          11.0                      . NonHDL 03/31/2017  171.69   Final   NOTE:  Non-HDL goal should be 30 mg/dL higher than patient's LDL goal (i.e. LDL goal of < 70 mg/dL, would have non-HDL goal of < 100 mg/dL)  . Microalb, Ur 03/31/2017 0.7  0.0 - 1.9 mg/dL Final  . Creatinine,U 16/11/9602 161.0  mg/dL Final  . Microalb Creat Ratio 03/31/2017 0.4  0.0 - 30.0 mg/g Final  . Sodium 03/31/2017 138  135 - 145 mEq/L Final  . Potassium 03/31/2017 4.5  3.5 - 5.1 mEq/L Final  . Chloride 03/31/2017 103  96 - 112 mEq/L Final  . CO2 03/31/2017 27  19 - 32 mEq/L Final  . Glucose, Bld 03/31/2017 159* 70 - 99 mg/dL Final  . BUN 54/10/8117 14  6 - 23 mg/dL Final  . Creatinine, Ser 03/31/2017 0.90  0.40 - 1.20 mg/dL Final  . Total Bilirubin 03/31/2017 0.5  0.2 - 1.2 mg/dL Final  . Alkaline Phosphatase 03/31/2017 76  39 - 117 U/L Final  . AST 03/31/2017 17  0 - 37 U/L Final  . ALT 03/31/2017 12  0 - 35 U/L Final  . Total Protein 03/31/2017 7.0  6.0 - 8.3 g/dL Final  . Albumin 14/78/2956 3.9  3.5 - 5.2 g/dL Final  . Calcium 21/30/8657 9.6  8.4 - 10.5 mg/dL Final  . GFR 84/69/6295 65.53  >60.00 mL/min Final  . Hgb A1c MFr Bld 03/31/2017 7.7* 4.6 - 6.5 % Final   Glycemic Control Guidelines for People with Diabetes:Non Diabetic:  <6%Goal of Therapy: <7%Additional Action Suggested:  >8%     Allergies as of 04/02/2017      Reactions   Atorvastatin    REACTION: joint pains   Influenza Vaccines    Patient got bursitis after her vaccine   Losartan Potassium    REACTION: blurred vision   Metoprolol Succinate    REACTION: trigger fingers, arm pain   Olmesartan Medoxomil    Quinapril Hcl    REACTION: throat swelling      Medication List        Accurate as of 04/02/17  8:29 PM. Always use your most recent med list.          insulin lispro 100 UNIT/ML injection Commonly known as:  HUMALOG INJECT 60 UNITS DAILY VIA  INSULIN PUMP   levothyroxine 100 MCG tablet Commonly known as:  SYNTHROID, LEVOTHROID TAKE 1 TABLET(100 MCG) BY MOUTH DAILY        Allergies:  Allergies  Allergen Reactions  . Atorvastatin     REACTION: joint pains  . Influenza Vaccines     Patient got bursitis after her vaccine  . Losartan Potassium     REACTION: blurred vision  . Metoprolol Succinate     REACTION: trigger fingers, arm pain  . Olmesartan Medoxomil   . Quinapril Hcl     REACTION: throat swelling    Past Medical History:  Diagnosis Date  . ANXIETY 10/28/2007  . BACK PAIN 12/23/2007  . CAROTID BRUIT 07/28/2009  . CORONARY ARTERY DISEASE 07/22/2007  . DIABETES MELLITUS, TYPE I 07/22/2007  . GERD 12/23/2007  . HAIR LOSS 02/17/2009  . HYPERLIPIDEMIA 07/22/2007  . HYPERTENSION 07/22/2007  . HYPOTHYROIDISM 07/22/2007  . SKIN LESION 10/27/2007  . URI 12/23/2007    Past Surgical History:  Procedure  Laterality Date  . CATARACT EXTRACTION    . CESAREAN SECTION     x2  . CORONARY ARTERY BYPASS GRAFT    . EYE SURGERY    . s/p coronary stent    . TUBAL LIGATION      Family History  Problem Relation Age of Onset  . Hypertension Mother   . Melanoma Mother   . Alcohol abuse Sister   . Arthritis Maternal Grandmother   . Diabetes Maternal Grandmother   . Heart disease Neg Hx     Social History:  reports that  has never smoked. she has never used smokeless tobacco. She reports that she does not drink alcohol or use drugs.  REVIEW of systems:  Eye exams with Dr. Luciana Axe , has retinopathy, Stable.   HYPOTHYROIDISM: She has had long-standing primary hypothyroidism and previously has been on either Armour Thyroid or levothyroxine. She thinks she did not tolerate Armour Thyroid.  She has been taking 100 g , previously 88 mcg   No consistent problems with fatigue recently  With taking her medication in the morning she would tend to forget and is taking this at night as before However she is not on an empty stomach at that time  TSH is high normal:   Lab Results  Component Value Date   TSH 3.71 03/31/2017   TSH 0.88 09/23/2016   TSH  0.70 05/10/2016   FREET4 0.93 03/31/2017   FREET4 1.30 09/23/2016   FREET4 0.96 08/16/2015     ?  Hypertension She has had mildly increased blood pressure readings Again she does not want to take any medications, this has been recommended by her PCP also Not recently checking at home  BP Readings from Last 3 Encounters:  04/02/17 (!) 142/86  01/03/17 136/84  10/14/16 138/78      Hyperlipidemia:  On several of her previous visits she has agreed to restart taking Zocor but again has not done this Still refusing to consider medications for treatment despite explaining the benefits  Her diet appears to be relatively low in saturated fat    Her lipids are not controlled and LDL is higher than before    Lab Results  Component Value Date   CHOL 241 (H) 03/31/2017   HDL 69.40 03/31/2017   LDLCALC 159 (H) 03/31/2017   LDLDIRECT 137.3 02/24/2013   TRIG 62.0 03/31/2017   CHOLHDL 3 03/31/2017     EXAM:  BP (!) 142/86 (BP Location: Left Arm, Cuff Size: Normal)   Pulse 79   Ht 5\' 1"  (1.549 m)   Wt 163 lb 9.6 oz (74.2 kg)   SpO2 98%   BMI 30.91 kg/m    ASSESSMENT:  DIABETES type 1:  See history of present illness for detailed discussion of current management, blood sugar patterns and problems identified  A1c is still relatively high at 7.7  As discussed above she has a new pattern of significant hypoglycemia between about 10 AM until 6 PM with blood sugars averaging over 200 midday and early afternoon Not sure why her blood sugars tend to rise in the mornings without any food intake also However blood sugars appear to be relatively higher on an average after lunch and tend to be higher for longer period of time She has variable patterns after evening meal but no consistent high or low readings She does not appear to have a high fat diet  Also needs to probably try to cover her various types of meals at breakfast and  lunch better and not clear if she would benefit from  seeing the dietitian again She may benefit from [facility] but this is not covered by her insurance  Recommendations:  She will try and bolus for rising blood sugar in the morning to correct the down phenomenon  She will need to change her carbohydrate coverage 1: 9 instead of 12 at lunchtime  No change for dinnertime coverage  Since she has relatively resistant hyperglycemia during the day time she will change the sensitivity to 1: 40 from 6 AM up to 2 PM  She does need to try and exercise especially morning and midday but she is usually not motivated to do this  Follow-up A1c in 3 months  Mild HYPERTENSION: Blood pressure is still high and she is still not wanting to start treatment, she will also discuss further with PCP   HYPOTHYROIDISM: Her TSH is trending higher She does take her medication at bedtime She is not usually eating breakfast now and she agrees to take the medication in the morning before eating   Hyperlipidemia:  She does need to start a statin drug as discussed on each visit Again she refuses this and discussed that she is at high risk for cardiovascular events despite her good diet and she will think about it again   Counseling time on subjects discussed in assessment and plan sections is over 50% of today's 25 minute visit   Patient Instructions  Take Synthroid in am  Check BP      Reather LittlerAjay Musa Rewerts 04/02/2017, 8:29 PM

## 2017-04-02 NOTE — Patient Instructions (Addendum)
Take Synthroid in am  Check BP

## 2017-04-03 ENCOUNTER — Telehealth: Payer: Self-pay

## 2017-04-03 ENCOUNTER — Other Ambulatory Visit: Payer: Self-pay

## 2017-04-03 NOTE — Telephone Encounter (Signed)
Called patient and verified that she had her copy of her carb ratio change to 1:9 at noon, sensitivity change 1:40 at 6am and 45 at 2pm. She stated that she had the paper and will change these settings.

## 2017-04-22 ENCOUNTER — Other Ambulatory Visit: Payer: Self-pay

## 2017-04-22 ENCOUNTER — Telehealth: Payer: Self-pay | Admitting: Endocrinology

## 2017-04-22 MED ORDER — LEVOTHYROXINE SODIUM 100 MCG PO TABS: ORAL_TABLET | ORAL | 5 refills | Status: DC

## 2017-04-22 NOTE — Telephone Encounter (Signed)
levothyroxine (SYNTHROID, LEVOTHROID) 100 MCG tablet   [facility] is calling about prescription for the patient And switching the brands.  They are needing some clarification on the medication.   [phone]

## 2017-05-07 NOTE — Telephone Encounter (Signed)
Ok to switch brands please advise

## 2017-05-07 NOTE — Telephone Encounter (Signed)
Has been changed to [employer] manufacturer same dose levothyroxine

## 2017-05-07 NOTE — Telephone Encounter (Signed)
Okay to switch brands.  If they can get Unithroid or Levoxyl brands without extra cost would prefer 1 of these

## 2017-06-12 ENCOUNTER — Encounter: Payer: Self-pay | Admitting: Internal Medicine

## 2017-06-12 ENCOUNTER — Ambulatory Visit (INDEPENDENT_AMBULATORY_CARE_PROVIDER_SITE_OTHER)
Admission: RE | Admit: 2017-06-12 | Discharge: 2017-06-12 | Disposition: A | Discharge status: Home / Self Care | Payer: Commercial Managed Care - PPO | Source: Ambulatory Visit | Attending: Internal Medicine | Admitting: Internal Medicine

## 2017-06-12 ENCOUNTER — Ambulatory Visit: Payer: Commercial Managed Care - PPO | Admitting: Internal Medicine

## 2017-06-12 VITALS — BP 128/82 | HR 85 | Temp 98.4°F | Ht 61.0 in | Wt 157.0 lb

## 2017-06-12 DIAGNOSIS — M25531 Pain in right wrist: Secondary | ICD-10-CM

## 2017-06-12 DIAGNOSIS — I10 Essential (primary) hypertension: Secondary | ICD-10-CM

## 2017-06-12 DIAGNOSIS — E1065 Type 1 diabetes mellitus with hyperglycemia: Secondary | ICD-10-CM | POA: Diagnosis not present

## 2017-06-12 NOTE — Progress Notes (Signed)
Subjective:    Patient ID: Stacy Cain, female    DOB: 1945-10-07, 72 y.o.   MRN: [id number]  HPI  Here with pain to right wrist x 3 wks after a fal, now 4/10 just not getting better, with some persistent swelling but otherwise seems to be able to use the hand ok without numbness, weakness or dropping things.  Worse to pronate and supinate, but not with wrist flexion and extension.  Pt denies chest pain, increased sob or doe, wheezing, orthopnea, PND, increased LE swelling, palpitations, dizziness or syncope. Pt denies new neurological symptoms such as new headache, or facial or extremity weakness or numbness   Pt denies polydipsia, polyuria Past Medical History:  Diagnosis Date  . ANXIETY 10/28/2007  . BACK PAIN 12/23/2007  . CAROTID BRUIT 07/28/2009  . CORONARY ARTERY DISEASE 07/22/2007  . DIABETES MELLITUS, TYPE I 07/22/2007  . GERD 12/23/2007  . HAIR LOSS 02/17/2009  . HYPERLIPIDEMIA 07/22/2007  . HYPERTENSION 07/22/2007  . HYPOTHYROIDISM 07/22/2007  . SKIN LESION 10/27/2007  . URI 12/23/2007   Past Surgical History:  Procedure Laterality Date  . CATARACT EXTRACTION    . CESAREAN SECTION     x2  . CORONARY ARTERY BYPASS GRAFT    . EYE SURGERY    . s/p coronary stent    . TUBAL LIGATION      reports that she has never smoked. She has never used smokeless tobacco. She reports that she does not drink alcohol or use drugs. family history includes Alcohol abuse in her sister; Arthritis in her maternal grandmother; Diabetes in her maternal grandmother; Hypertension in her mother; Melanoma in her mother. Allergies  Allergen Reactions  . Atorvastatin     REACTION: joint pains  . Influenza Vaccines     Patient got bursitis after her vaccine  . Losartan Potassium     REACTION: blurred vision  . Metoprolol Succinate     REACTION: trigger fingers, arm pain  . Olmesartan Medoxomil   . Quinapril Hcl     REACTION: throat swelling   Current Outpatient Medications on File Prior to Visit    Medication Sig Dispense Refill  . insulin lispro (HUMALOG) 100 UNIT/ML injection INJECT 60 UNITS DAILY VIA  INSULIN PUMP 60 mL 2  . levothyroxine (SYNTHROID, LEVOTHROID) 100 MCG tablet TAKE 1 TABLET(100 MCG) BY MOUTH DAILY 90 tablet 5   No current facility-administered medications on file prior to visit.    Review of Systems  Constitutional: Negative for other unusual diaphoresis or sweats HENT: Negative for ear discharge or swelling Eyes: Negative for other worsening visual disturbances Respiratory: Negative for stridor or other swelling  Gastrointestinal: Negative for worsening distension or other blood Genitourinary: Negative for retention or other urinary change Musculoskeletal: Negative for other MSK pain or swelling Skin: Negative for color change or other new lesions Neurological: Negative for worsening tremors and other numbness  Psychiatric/Behavioral: Negative for worsening agitation or other fatigue All other system neg per pt    Objective:   Physical Exam BP 128/82   Pulse 85   Temp 98.4 F (36.9 C) (Oral)   Ht  (1.549 m)   Wt 157 lb (71.2 kg)   SpO2 96%   BMI 29.66 kg/m  VS noted,  Constitutional: Pt appears in NAD HENT: Head: NCAT.  Right Ear: External ear normal.  Left Ear: External ear normal.  Eyes: . Pupils are equal, round, and reactive to light. Conjunctivae and EOM are normal Nose: without d/c or deformity Neck:  Neck supple. Gross normal ROM Cardiovascular: Normal rate and regular rhythm.   Pulmonary/Chest: Effort normal and breath sounds without rales or wheezing.  Right arm with distal diffuse tender and swelling mostly to the anterior distal arm just above the wrist, hand o/w neurovasc intact Neurological: Pt is alert. At baseline orientation, motor grossly intact Skin: Skin is warm. No rashes, other new lesions, no LE edema Psychiatric: Pt behavior is normal without agitation  No other exam findings    Assessment & Plan:

## 2017-06-12 NOTE — Patient Instructions (Signed)
Please continue all other medications as before, and refills have been done if requested.  Please have the pharmacy call with any other refills you may need.  Please continue your efforts at being more active, low cholesterol diet, and weight control.  You are otherwise up to date with prevention measures today.  Please keep your appointments with your specialists as you may have planned  Please go to the XRAY Department in the Basement (go straight as you get off the elevator) for the x-ray testing  You will be contacted by phone if any changes need to be made immediately.  Otherwise, you will receive a letter about your results with an explanation, but please check with MyChart first.  Please remember to sign up for MyChart if you have not done so, as this will be important to you in the future with finding out test results, communicating by private email, and scheduling acute appointments online when needed. 

## 2017-06-14 NOTE — Assessment & Plan Note (Signed)
stable overall by history and exam, recent data reviewed with pt, and pt to continue medical treatment as before,  to f/u any worsening symptoms or concerns BP Readings from Last 3 Encounters:  06/12/17 128/82  04/02/17 (!) 142/86  01/03/17 136/84

## 2017-06-14 NOTE — Assessment & Plan Note (Signed)
Cant r/o fx after fall, for film today, pain control,  to f/u any worsening symptoms or concerns

## 2017-06-14 NOTE — Assessment & Plan Note (Signed)
Lab Results  Component Value Date   HGBA1C 7.7 (H) 03/31/2017  stable overall by history and exam, recent data reviewed with pt, and pt to continue medical treatment as before,  to f/u any worsening symptoms or concerns

## 2017-06-26 ENCOUNTER — Other Ambulatory Visit (INDEPENDENT_AMBULATORY_CARE_PROVIDER_SITE_OTHER): Payer: Commercial Managed Care - PPO

## 2017-06-26 DIAGNOSIS — E7800 Pure hypercholesterolemia, unspecified: Secondary | ICD-10-CM

## 2017-06-26 DIAGNOSIS — E1065 Type 1 diabetes mellitus with hyperglycemia: Secondary | ICD-10-CM | POA: Diagnosis not present

## 2017-06-26 LAB — BASIC METABOLIC PANEL
BUN: 15 mg/dL (ref 6–23)
CHLORIDE: 104 meq/L (ref 96–112)
CO2: 26 meq/L (ref 19–32)
CREATININE: 0.86 mg/dL (ref 0.40–1.20)
Calcium: 9.5 mg/dL (ref 8.4–10.5)
GFR: 69.01 mL/min (ref >60.00)
Glucose, Bld: 161 mg/dL — ABNORMAL HIGH (ref 70–99)
POTASSIUM: 4.6 meq/L (ref 3.5–5.1)
Sodium: 138 mEq/L (ref 135–145)

## 2017-06-26 LAB — LDL CHOLESTEROL, DIRECT: LDL DIRECT: 114 mg/dL

## 2017-06-26 LAB — HEMOGLOBIN A1C: HEMOGLOBIN A1C: 7.5 % — AB (ref 4.6–6.5)

## 2017-07-02 ENCOUNTER — Encounter: Payer: Self-pay | Admitting: Endocrinology

## 2017-07-02 ENCOUNTER — Ambulatory Visit: Payer: Commercial Managed Care - PPO | Admitting: Endocrinology

## 2017-07-02 VITALS — BP 134/82 | HR 81 | Ht 61.0 in | Wt 157.8 lb

## 2017-07-02 DIAGNOSIS — E1065 Type 1 diabetes mellitus with hyperglycemia: Secondary | ICD-10-CM | POA: Diagnosis not present

## 2017-07-02 DIAGNOSIS — E063 Autoimmune thyroiditis: Secondary | ICD-10-CM

## 2017-07-02 DIAGNOSIS — E7800 Pure hypercholesterolemia, unspecified: Secondary | ICD-10-CM

## 2017-07-02 NOTE — Progress Notes (Signed)
Patient ID: Stacy Cain, female   DOB: 06-20-1945, 72 y.o.   MRN: [id number]   Reason for Appointment:  followup:   History of Present Illness   Diagnosis: Type 1 DIABETES MELITUS, date of diagnosis:  1974  DIABETES history: She has been on an insulin pump for several years with variable control. She is fairly compliant with monitoring her blood sugars and doing some adjustments of her pump settings but continues to have variability in blood sugar  Her A1c has previously been stable between 7-7.4%  CURRENT insulin pump:  Medtronic 670  She has has been on the 670 pump and in the auto mode since 04/10/16   RECENT HISTORY:   PUMP SETTINGS are: Basal rate: Midnight = 0.6; 7 AM = 1.5,  2 PM = 1.0, 6 PM = 0.975, 10 PM = 0.8 Carb Ratio: 1: 9 breakfast, 1: 12, 1:10 at 5 PM   Correction factor 1: 50 at 6 AM and 40 at 12 noon until 2 PM and then 45; 55 overnight, target 110, active insulin 3-1/2 hours  Her A1c has been still above 7% target and ranging between 7.5-7.7 most recently  Recommendations made on her last visit that have been implemented also:   She will try and bolus for rising blood sugar in the morning to correct the down phenomenon  She will need to change her carbohydrate coverage 1: 9 instead of 12 at lunchtime  She will change the sensitivity to 1: 40 from 6 AM up to 2 PM   Current blood sugar patterns, management and problems identified:   She has had HYPERGLYCEMIC patterns occurring midday, some in the early afternoon and occasionally around 6-8 PM  However these are inconsistent and likely to be from variability in her mealtime coverage  POSTPRANDIAL readings because of her not bolusing in time before eating  She has not had as much not been on them but is not bolusing as directed to keep blood sugar from rising, usually not eating her first meal until about 10 AM  POSTPRANDIAL readings are fairly flat with some variability but better than before after  lunch  Her diet indicates that she is usually avoiding high fat foods and does not eat out much  her active insulin time is 3-1/2 hours, although appears that her sugars are trending down at 3 hours this is not causing low blood sugars or subsequent rise  Her weight has been consistent, lower than in February  FASTING blood sugars are generally fairly good when she wakes up  Although she is stating she is taking some insulin in the morning before breakfast to keep her dawn phenomenon controlled do not see consistent boluses unless blood sugars are relatively high  HYPOGLYCEMIA has been minimal, however she may tend to feel a little hypoglycemic when she is going shopping and forgets to use a temporary target  CGM use % of time  90, auto mode 95%  Average and SD  153+/-47  Time in range   76 %, and 2/19 = 63  % Time Above 180  24%, was 35  % Time above 250  5  % Time Below target  0   Mean values apply above for all meters except median for One Touch  PRE-MEAL Fasting Lunch Dinner Bedtime Overall  Glucose range:  101-277      Mean/median:   176  167     POST-MEAL PC Breakfast PC Lunch PC Dinner  Glucose range:  Mean/median:   167  150   EXERCISE:She is not doing any exercise and only likes to go walking for shopping     Wt Readings from Last 3 Encounters:  07/02/17 157 lb 12.8 oz (71.6 kg)  06/12/17 157 lb (71.2 kg)  04/02/17 163 lb 9.6 oz (74.2 kg)     Lab Results  Component Value Date   HGBA1C 7.5 (H) 06/26/2017   HGBA1C 7.7 (H) 03/31/2017   HGBA1C 7.5 (H) 12/30/2016   Lab Results  Component Value Date   MICROALBUR 0.7 03/31/2017   LDLCALC 159 (H) 03/31/2017   CREATININE 0.86 06/26/2017    OTHER active problems: See review of systems  Lab on 06/26/2017  Component Date Value Ref Range Status  . Direct LDL 06/26/2017 114.0  mg/dL Final   Optimal:  <102 mg/dLNear or Above Optimal:  100-129 mg/dLBorderline High:  130-159 mg/dLHigh:  160-189 mg/dLVery High:   >190 mg/dL  . Sodium 06/26/2017 138  135 - 145 mEq/L Final  . Potassium 06/26/2017 4.6  3.5 - 5.1 mEq/L Final  . Chloride 06/26/2017 104  96 - 112 mEq/L Final  . CO2 06/26/2017 26  19 - 32 mEq/L Final  . Glucose, Bld 06/26/2017 161* 70 - 99 mg/dL Final  . BUN 72/53/6644 15  6 - 23 mg/dL Final  . Creatinine, Ser 06/26/2017 0.86  0.40 - 1.20 mg/dL Final  . Calcium 03/47/4259 9.5  8.4 - 10.5 mg/dL Final  . GFR 56/38/7564 69.01  >60.00 mL/min Final  . Hgb A1c MFr Bld 06/26/2017 7.5* 4.6 - 6.5 % Final   Glycemic Control Guidelines for People with Diabetes:Non Diabetic:  <6%Goal of Therapy: <7%Additional Action Suggested:  >8%     Allergies as of 07/02/2017      Reactions   Atorvastatin    REACTION: joint pains   Influenza Vaccines    Patient got bursitis after her vaccine   Losartan Potassium    REACTION: blurred vision   Metoprolol Succinate    REACTION: trigger fingers, arm pain   Olmesartan Medoxomil    Quinapril Hcl    REACTION: throat swelling      Medication List        Accurate as of 07/02/17  1:21 PM. Always use your most recent med list.          insulin lispro 100 UNIT/ML injection Commonly known as:  HUMALOG INJECT 60 UNITS DAILY VIA  INSULIN PUMP   levothyroxine 100 MCG tablet Commonly known as:  SYNTHROID, LEVOTHROID TAKE 1 TABLET(100 MCG) BY MOUTH DAILY       Allergies:  Allergies  Allergen Reactions  . Atorvastatin     REACTION: joint pains  . Influenza Vaccines     Patient got bursitis after her vaccine  . Losartan Potassium     REACTION: blurred vision  . Metoprolol Succinate     REACTION: trigger fingers, arm pain  . Olmesartan Medoxomil   . Quinapril Hcl     REACTION: throat swelling    Past Medical History:  Diagnosis Date  . ANXIETY 10/28/2007  . BACK PAIN 12/23/2007  . CAROTID BRUIT 07/28/2009  . CORONARY ARTERY DISEASE 07/22/2007  . DIABETES MELLITUS, TYPE I 07/22/2007  . GERD 12/23/2007  . HAIR LOSS 02/17/2009  . HYPERLIPIDEMIA  07/22/2007  . HYPERTENSION 07/22/2007  . HYPOTHYROIDISM 07/22/2007  . SKIN LESION 10/27/2007  . URI 12/23/2007    Past Surgical History:  Procedure Laterality Date  . CATARACT EXTRACTION    . CESAREAN SECTION  x2  . CORONARY ARTERY BYPASS GRAFT    . EYE SURGERY    . s/p coronary stent    . TUBAL LIGATION      Family History  Problem Relation Age of Onset  . Hypertension Mother   . Melanoma Mother   . Alcohol abuse Sister   . Arthritis Maternal Grandmother   . Diabetes Maternal Grandmother   . Heart disease Neg Hx     Social History:  reports that she has never smoked. She has never used smokeless tobacco. She reports that she does not drink alcohol or use drugs.  REVIEW of systems:  Eye exams with Dr. Luciana Axe , has retinopathy, Stable.   HYPOTHYROIDISM: She has had long-standing primary hypothyroidism and previously has been on either Armour Thyroid or levothyroxine. She thinks she did not tolerate Armour Thyroid.  She has been taking 100 g , previously 88 mcg   No consistent problems with fatigue recently  With taking her medication in the morning she would tend to forget and is taking this at night as before However she is not on an empty stomach at that time  TSH is high normal:   Lab Results  Component Value Date   TSH 3.71 03/31/2017   TSH 0.88 09/23/2016   TSH 0.70 05/10/2016   FREET4 0.93 03/31/2017   FREET4 1.30 09/23/2016   FREET4 0.96 08/16/2015     ?  Hypertension She has had mildly increased blood pressure readings Again she does not want to take any medications, this has been recommended by her PCP also Not recently checking at home  BP Readings from Last 3 Encounters:  07/02/17 134/82  06/12/17 128/82  04/02/17 (!) 142/86      Hyperlipidemia:  On several of her previous visits she has agreed to restart taking Zocor but again has not done this Still refusing to consider medications for treatment despite explaining the benefits  Her  diet appears to be relatively low in saturated fat    Her lipids are not controlled and LDL is higher than before    Lab Results  Component Value Date   CHOL 241 (H) 03/31/2017   HDL 69.40 03/31/2017   LDLCALC 159 (H) 03/31/2017   LDLDIRECT 114.0 06/26/2017   TRIG 62.0 03/31/2017   CHOLHDL 3 03/31/2017     EXAM:  BP 134/82 (BP Location: Left Arm, Patient Position: Sitting, Cuff Size: Normal)   Pulse 81   Ht  (1.549 m)   Wt 157 lb 12.8 oz (71.6 kg)   SpO2 95%   BMI 29.82 kg/m    ASSESSMENT:  DIABETES type 1:  See history of present illness for detailed discussion of current management, blood sugar patterns and problems identified  A1c is still relatively high at 7.5  Her blood sugars are reasonably well controlled and most of her high readings are related to either inadequate bolusing at either lunch or supper and occasionally taking the bolus too late after starting to eat Also may still have a tendency to high readings before her breakfast but she is not covering these with a small bolus as directed However she has not had any hypoglycemia and her blood sugars are more consistently within target  Day-to-day management of her diabetes, bolusing and blood sugar targets was reviewed in detail again She will try to get the new transmitter from [employer] and information given about this Reminded her to use a temporary target of 150 when going shopping  Follow-up A1c in  4 months  Mild HYPERTENSION: Blood pressure is relatively better    HYPOTHYROIDISM: Her TSH is slightly higher on the last visit and she again was told to take her levothyroxine in the morning and not bedtime   Hyperlipidemia:  She does need to have cardiovascular protection with a statin drug but she refused to do this consistently Surprisingly however direct LDL is lower at 114   Counseling time on subjects discussed in assessment and plan sections is over 50% of today's 25 minute  visit   Patient Instructions  [email or web address]      Temp target before shopping  Take thyroid in am    Reather Littler 07/02/2017, 1:21 PM

## 2017-07-02 NOTE — Patient Instructions (Addendum)
[email or web address]      Temp target before shopping  Take thyroid in am

## 2017-07-17 ENCOUNTER — Telehealth: Payer: Self-pay

## 2017-07-17 NOTE — Telephone Encounter (Signed)
Received fax from [facility] stating that pt is approved for Contour blood glucose test strips. Approval is good through 07/09/2018.

## 2017-08-01 ENCOUNTER — Telehealth: Payer: Self-pay | Admitting: Endocrinology

## 2017-08-01 NOTE — Telephone Encounter (Signed)
Patient stated she is needing a prescription for Contour next test strips sent into her pharmacy     [facility] [phone] - [place],  - [address] [facility] [address]

## 2017-08-04 ENCOUNTER — Other Ambulatory Visit: Payer: Self-pay

## 2017-08-04 ENCOUNTER — Telehealth: Payer: Self-pay | Admitting: Endocrinology

## 2017-08-04 MED ORDER — GLUCOSE BLOOD VI STRP: ORAL_STRIP | 3 refills | Status: DC

## 2017-08-04 NOTE — Telephone Encounter (Signed)
Rx sent to pharmacy for testing 5 times daily.

## 2017-08-04 NOTE — Telephone Encounter (Signed)
Noted  

## 2017-08-04 NOTE — Telephone Encounter (Signed)
Patient said to let you know she that she test 5 times a day.

## 2017-08-04 NOTE — Telephone Encounter (Signed)
Called pt to find out how many times she is testing per day. Currently waiting on call back from pt with this information.

## 2017-08-15 ENCOUNTER — Telehealth: Payer: Self-pay | Admitting: Endocrinology

## 2017-08-15 NOTE — Telephone Encounter (Signed)
Patient requests Stacy Cain to call her at ph# [phone]

## 2017-08-18 ENCOUNTER — Other Ambulatory Visit: Payer: Self-pay

## 2017-08-18 MED ORDER — GLUCOSE BLOOD VI STRP: ORAL_STRIP | 1 refills | Status: DC

## 2017-08-18 NOTE — Telephone Encounter (Signed)
Called pt and left voicemail requesting her to call office back.

## 2017-08-18 NOTE — Telephone Encounter (Signed)
Pt needed test strips sent to [facility] for 90 day supply. This was done per pt request.

## 2017-08-27 ENCOUNTER — Telehealth: Payer: Self-pay | Admitting: Emergency Medicine

## 2017-08-27 ENCOUNTER — Other Ambulatory Visit: Payer: Self-pay

## 2017-08-27 MED ORDER — GLUCOSE BLOOD VI STRP: ORAL_STRIP | 1 refills | Status: DC

## 2017-08-27 NOTE — Telephone Encounter (Signed)
Pharmacy called and wants to know if she can get a refill on her glucose blood (CONTOUR NEXT TEST) test strip. Pharmacy is [facility]. Thanks.

## 2017-08-27 NOTE — Telephone Encounter (Signed)
Rx sent to [facility]

## 2017-11-11 ENCOUNTER — Other Ambulatory Visit (INDEPENDENT_AMBULATORY_CARE_PROVIDER_SITE_OTHER): Payer: Commercial Managed Care - PPO

## 2017-11-11 DIAGNOSIS — E1065 Type 1 diabetes mellitus with hyperglycemia: Secondary | ICD-10-CM | POA: Diagnosis not present

## 2017-11-11 DIAGNOSIS — E063 Autoimmune thyroiditis: Secondary | ICD-10-CM | POA: Diagnosis not present

## 2017-11-11 LAB — TSH: TSH: 0.95 u[IU]/mL (ref 0.35–4.50)

## 2017-11-11 LAB — COMPREHENSIVE METABOLIC PANEL
ALK PHOS: 84 U/L (ref 39–117)
ALT: 9 U/L (ref 0–35)
AST: 15 U/L (ref 0–37)
Albumin: 4.1 g/dL (ref 3.5–5.2)
BUN: 16 mg/dL (ref 6–23)
CALCIUM: 9.5 mg/dL (ref 8.4–10.5)
CO2: 28 mEq/L (ref 19–32)
Chloride: 103 mEq/L (ref 96–112)
Creatinine, Ser: 0.89 mg/dL (ref 0.40–1.20)
GFR: 66.27 mL/min (ref >60.00)
GLUCOSE: 164 mg/dL — AB (ref 70–99)
Potassium: 4.7 mEq/L (ref 3.5–5.1)
Sodium: 137 mEq/L (ref 135–145)
TOTAL PROTEIN: 7.5 g/dL (ref 6.0–8.3)
Total Bilirubin: 0.5 mg/dL (ref 0.2–1.2)

## 2017-11-11 LAB — T4, FREE: FREE T4: 1.04 ng/dL (ref 0.60–1.60)

## 2017-11-11 LAB — HEMOGLOBIN A1C: Hgb A1c MFr Bld: 7.7 % — ABNORMAL HIGH (ref 4.6–6.5)

## 2017-11-13 ENCOUNTER — Encounter: Payer: Self-pay | Admitting: Endocrinology

## 2017-11-13 ENCOUNTER — Ambulatory Visit: Payer: Commercial Managed Care - PPO | Admitting: Endocrinology

## 2017-11-13 VITALS — BP 140/80 | HR 85 | Ht 64.0 in | Wt 153.0 lb

## 2017-11-13 DIAGNOSIS — E063 Autoimmune thyroiditis: Secondary | ICD-10-CM | POA: Diagnosis not present

## 2017-11-13 DIAGNOSIS — E1065 Type 1 diabetes mellitus with hyperglycemia: Secondary | ICD-10-CM | POA: Diagnosis not present

## 2017-11-13 NOTE — Progress Notes (Signed)
Patient ID: Stacy Cain, female   DOB: [birth year], 72 y.o.   MRN: [id number]   Reason for Appointment: Endocrinology followup:   History of Present Illness   Diagnosis: Type 1 DIABETES MELITUS, date of diagnosis:  1974  DIABETES history: She has been on an insulin pump for several years with variable control. She is fairly compliant with monitoring her blood sugars and doing some adjustments of her pump settings but continues to have variability in blood sugar  Her A1c has previously been stable between 7-7.4%  CURRENT insulin pump:  Medtronic 670  She has has been on the 670 pump and in the auto mode since 04/10/16   RECENT HISTORY:   PUMP SETTINGS are: Basal rate: Midnight = 0.6; 7 AM = 1.5,  2 PM = 1.0, 6 PM = 0.975, 10 PM = 0.8 Carb Ratio: 1: 9 breakfast and lunch, 1:10 at 5 PM   Correction factor 1: 40 at 6 AM until 2 PM and then 45; 55 overnight, target 110, active insulin 3-1/2 hours  Her A1c has been still above 7% target and ranging between 7.5-7.7 recently   Current blood sugar patterns, management and problems identified:   She has about the same average blood sugar levels as on her last visit in 5/19  Again has had  HYPERGLYCEMIC patterns seen late morning and occasionally after lunch  She is usually having a significant increase in her blood sugar after drinking her coffee around 9-10 AM which may be coupled with a dawn phenomenon  Although she is trying to bolus at that time to keep her sugar from going up she will periodically forget  With this her blood sugars are averaging nearly 200 around 11 AM-12 noon  Blood sugars after lunch and dinner are variable but no consistent spikes at any given time  Some of her mealtime HYPERGLYCEMIA may be related to blood sugar going up before the bolus is effective; this is still with her trying to bolus before she starts eating but usually right when she is sitting down to eat  HYPOGLYCEMIA did occur once when she  was in the manual mode without any bolus; her basal rate is 1.5 at 7 AM  Overall her basal requirement is significantly less with the auto mode compared to when she was using the manual mode completely  Active insulin time appears to be fairly appropriate with blood sugars about 3 hours later not significantly low or high overall  CGM use % of time 87 , auto mode 94%  Average and SD  152  Time in range     72   %  % Time Above 180  24  % Time above 250  3  % Time Below target  1      PRE-MEAL Fasting Lunch Dinner  overnight Overall  Glucose range:  116-236    AC/PC   Mean/median:   181  168  181/165    POST-MEAL PC Breakfast PC Lunch PC Dinner  Glucose range:     Mean/median:   173  143     EXERCISE:She is not doing any exercise and only likes to go walking for shopping   Wt Readings from Last 3 Encounters:  11/13/17 153 lb (69.4 kg)  07/02/17 157 lb 12.8 oz (71.6 kg)  06/12/17 157 lb (71.2 kg)     Lab Results  Component Value Date   HGBA1C 7.7 (H) 11/11/2017   HGBA1C 7.5 (H) 06/26/2017   HGBA1C 7.7 (  H) 03/31/2017   Lab Results  Component Value Date   MICROALBUR 0.7 03/31/2017   LDLCALC 159 (H) 03/31/2017   CREATININE 0.89 11/11/2017    OTHER active problems: See review of systems  Lab on 11/11/2017  Component Date Value Ref Range Status  . Free T4 11/11/2017 1.04  0.60 - 1.60 ng/dL Final   Comment: Specimens from patients who are undergoing biotin therapy and /or ingesting biotin supplements may contain high levels of biotin.  The higher biotin concentration in these specimens interferes with this Free T4 assay.  Specimens that contain high levels  of biotin may cause false high results for this Free T4 assay.  Please interpret results in light of the total clinical presentation of the patient.    Marland Kitchen TSH 11/11/2017 0.95  0.35 - 4.50 uIU/mL Final  . Sodium 11/11/2017 137  135 - 145 mEq/L Final  . Potassium 11/11/2017 4.7  3.5 - 5.1 mEq/L Final  . Chloride  11/11/2017 103  96 - 112 mEq/L Final  . CO2 11/11/2017 28  19 - 32 mEq/L Final  . Glucose, Bld 11/11/2017 164* 70 - 99 mg/dL Final  . BUN 65/78/4696 16  6 - 23 mg/dL Final  . Creatinine, Ser 11/11/2017 0.89  0.40 - 1.20 mg/dL Final  . Total Bilirubin 11/11/2017 0.5  0.2 - 1.2 mg/dL Final  . Alkaline Phosphatase 11/11/2017 84  39 - 117 U/L Final  . AST 11/11/2017 15  0 - 37 U/L Final  . ALT 11/11/2017 9  0 - 35 U/L Final  . Total Protein 11/11/2017 7.5  6.0 - 8.3 g/dL Final  . Albumin 29/52/8413 4.1  3.5 - 5.2 g/dL Final  . Calcium 24/40/1027 9.5  8.4 - 10.5 mg/dL Final  . GFR 25/36/6440 66.27  >60.00 mL/min Final  . Hgb A1c MFr Bld 11/11/2017 7.7* 4.6 - 6.5 % Final   Glycemic Control Guidelines for People with Diabetes:Non Diabetic:  <6%Goal of Therapy: <7%Additional Action Suggested:  >8%     Allergies as of 11/13/2017      Reactions   Atorvastatin    REACTION: joint pains   Influenza Vaccines    Patient got bursitis after her vaccine   Losartan Potassium    REACTION: blurred vision   Metoprolol Succinate    REACTION: trigger fingers, arm pain   Olmesartan Medoxomil    Quinapril Hcl    REACTION: throat swelling      Medication List        Accurate as of 11/13/17 10:16 AM. Always use your most recent med list.          glucose blood test strip Use as instructed to check blood sugar 5 times daily. DX:E10.565   insulin lispro 100 UNIT/ML injection Commonly known as:  HUMALOG INJECT 60 UNITS DAILY VIA  INSULIN PUMP   levothyroxine 100 MCG tablet Commonly known as:  SYNTHROID, LEVOTHROID TAKE 1 TABLET(100 MCG) BY MOUTH DAILY       Allergies:  Allergies  Allergen Reactions  . Atorvastatin     REACTION: joint pains  . Influenza Vaccines     Patient got bursitis after her vaccine  . Losartan Potassium     REACTION: blurred vision  . Metoprolol Succinate     REACTION: trigger fingers, arm pain  . Olmesartan Medoxomil   . Quinapril Hcl     REACTION: throat  swelling    Past Medical History:  Diagnosis Date  . ANXIETY 10/28/2007  . BACK PAIN 12/23/2007  .  CAROTID BRUIT 07/28/2009  . CORONARY ARTERY DISEASE 07/22/2007  . DIABETES MELLITUS, TYPE I 07/22/2007  . GERD 12/23/2007  . HAIR LOSS 02/17/2009  . HYPERLIPIDEMIA 07/22/2007  . HYPERTENSION 07/22/2007  . HYPOTHYROIDISM 07/22/2007  . SKIN LESION 10/27/2007  . URI 12/23/2007    Past Surgical History:  Procedure Laterality Date  . CATARACT EXTRACTION    . CESAREAN SECTION     x2  . CORONARY ARTERY BYPASS GRAFT    . EYE SURGERY    . s/p coronary stent    . TUBAL LIGATION      Family History  Problem Relation Age of Onset  . Hypertension Mother   . Melanoma Mother   . Alcohol abuse Sister   . Arthritis Maternal Grandmother   . Diabetes Maternal Grandmother   . Heart disease Neg Hx     Social History:  reports that she has never smoked. She has never used smokeless tobacco. She reports that she does not drink alcohol or use drugs.  REVIEW of systems:  Eye exams with Dr. Luciana Axe , has retinopathy, Stable.   HYPOTHYROIDISM: She has had long-standing primary hypothyroidism and previously has been on either Armour Thyroid or levothyroxine. She thinks she did not tolerate Armour Thyroid.  She has been taking 100 g , previously 88 mcg  She feels fairly good  With taking her medication in the morning she would tend to forget and is taking this at night as before  TSH is normal:   Lab Results  Component Value Date   TSH 0.95 11/11/2017   TSH 3.71 03/31/2017   TSH 0.88 09/23/2016   FREET4 1.04 11/11/2017   FREET4 0.93 03/31/2017   FREET4 1.30 09/23/2016     Borderline hypertension She has had mildly increased blood pressure readings Usually she does not want to take any medications, this has been recommended by her PCP also Not checking at home  BP Readings from Last 3 Encounters:  11/13/17 140/80  07/02/17 134/82  06/12/17 128/82      Hyperlipidemia:  On several  of her previous visits she has agreed to restart taking Zocor but again has not done this Still refusing to consider medications for treatment despite explaining the benefits of risk reduction regardless of cholesterol levels Last LDL 114    Lab Results  Component Value Date   CHOL 241 (H) 03/31/2017   HDL 69.40 03/31/2017   LDLCALC 159 (H) 03/31/2017   LDLDIRECT 114.0 06/26/2017   TRIG 62.0 03/31/2017   CHOLHDL 3 03/31/2017     EXAM:  BP 140/80   Pulse 85   Ht 5\' 4"  (1.626 m)   Wt 153 lb (69.4 kg)   SpO2 98%   BMI 26.26 kg/m    ASSESSMENT:  DIABETES type 1:  See history of present illness for detailed discussion of current management, blood sugar patterns and problems identified  A1c is still relatively high at 7.7  Her blood sugars are variable after meals and tends to have high readings before lunch from a combination of dawn phenomena and increased blood sugar from drinking coffee in the morning and not bolusing Also sporadically may have high readings after meals partly from not bolusing ahead of time before eating but also depending on meal content Overnight blood sugars are excellent It appears that she is requiring relatively low amount of basal insulin, only about 16 units/day compared to 24 before she went on the auto mode  Advised her to try to be more consistent  with bolusing for her coffee in the morning and adjusting the bolus to keep the blood sugar from going more than at least 140 Also if she is able to bolus ahead of time with her meals he can do that Reduce basal rate at 7 AM down to 1.3 to avoid potential hypoglycemia at that time  Discussed management of her diabetes, mealtime bolusing, preventing hyperglycemia and hypoglycemia   Follow-up with A1c in 4 months  LIPIDS: She will again advised to consider a statin drug but she refuses    HYPOTHYROIDISM: Her TSH is back to normal and again reminded her to take her levothyroxine on empty  stomach   Counseling time on subjects discussed in assessment and plan sections is over 50% of today's 25 minute visit   There are no Patient Instructions on file for this visit.   Reather Littler 11/13/2017, 10:16 AM

## 2017-12-26 ENCOUNTER — Other Ambulatory Visit: Payer: Self-pay | Admitting: Endocrinology

## 2018-02-08 ENCOUNTER — Other Ambulatory Visit: Payer: Self-pay | Admitting: Endocrinology

## 2018-03-12 ENCOUNTER — Other Ambulatory Visit (INDEPENDENT_AMBULATORY_CARE_PROVIDER_SITE_OTHER): Payer: Commercial Managed Care - PPO

## 2018-03-12 DIAGNOSIS — E1065 Type 1 diabetes mellitus with hyperglycemia: Secondary | ICD-10-CM

## 2018-03-12 DIAGNOSIS — E063 Autoimmune thyroiditis: Secondary | ICD-10-CM

## 2018-03-12 LAB — COMPREHENSIVE METABOLIC PANEL
ALT: 8 U/L (ref 0–35)
AST: 12 U/L (ref 0–37)
Albumin: 4 g/dL (ref 3.5–5.2)
Alkaline Phosphatase: 87 U/L (ref 39–117)
BUN: 19 mg/dL (ref 6–23)
CO2: 30 meq/L (ref 19–32)
CREATININE: 0.95 mg/dL (ref 0.40–1.20)
Calcium: 9.5 mg/dL (ref 8.4–10.5)
Chloride: 103 mEq/L (ref 96–112)
GFR: 57.77 mL/min — ABNORMAL LOW (ref >60.00)
GLUCOSE: 163 mg/dL — AB (ref 70–99)
POTASSIUM: 5 meq/L (ref 3.5–5.1)
SODIUM: 138 meq/L (ref 135–145)
Total Bilirubin: 0.5 mg/dL (ref 0.2–1.2)
Total Protein: 6.7 g/dL (ref 6.0–8.3)

## 2018-03-12 LAB — LIPID PANEL
Cholesterol: 214 mg/dL — ABNORMAL HIGH (ref 0–200)
HDL: 65.4 mg/dL (ref >39.00)
LDL CALC: 129 mg/dL — AB (ref 0–99)
NONHDL: 148.13
Total CHOL/HDL Ratio: 3
Triglycerides: 94 mg/dL (ref 0.0–149.0)
VLDL: 18.8 mg/dL (ref 0.0–40.0)

## 2018-03-12 LAB — TSH: TSH: 0.17 u[IU]/mL — ABNORMAL LOW (ref 0.35–4.50)

## 2018-03-12 LAB — T4, FREE: Free T4: 1.39 ng/dL (ref 0.60–1.60)

## 2018-03-12 LAB — HEMOGLOBIN A1C: HEMOGLOBIN A1C: 7.7 % — AB (ref 4.6–6.5)

## 2018-03-17 ENCOUNTER — Encounter: Payer: Self-pay | Admitting: Endocrinology

## 2018-03-17 ENCOUNTER — Ambulatory Visit: Payer: Commercial Managed Care - PPO | Admitting: Endocrinology

## 2018-03-17 VITALS — BP 160/78 | HR 84 | Ht 64.0 in | Wt 153.2 lb

## 2018-03-17 DIAGNOSIS — E7800 Pure hypercholesterolemia, unspecified: Secondary | ICD-10-CM | POA: Diagnosis not present

## 2018-03-17 DIAGNOSIS — E063 Autoimmune thyroiditis: Secondary | ICD-10-CM | POA: Diagnosis not present

## 2018-03-17 DIAGNOSIS — E1065 Type 1 diabetes mellitus with hyperglycemia: Secondary | ICD-10-CM

## 2018-03-17 MED ORDER — SYNTHROID 88 MCG PO TABS: 88.0000 ug | ORAL_TABLET | Freq: Every day | ORAL | 2 refills | Status: DC

## 2018-03-17 MED ORDER — LEVOTHYROXINE SODIUM 88 MCG PO TABS: 88.0000 ug | ORAL_TABLET | Freq: Every day | ORAL | 3 refills | Status: DC

## 2018-03-17 NOTE — Progress Notes (Signed)
Patient ID: Stacy Cain, female   DOB: 1945-07-04, 73 y.o.   MRN: [id number]   Reason for Appointment: Endocrinology followup:   History of Present Illness   Diagnosis: Type 1 DIABETES MELITUS, date of diagnosis:  1974  DIABETES history: She has been on an insulin pump for several years with variable control. She is fairly compliant with monitoring her blood sugars and doing some adjustments of her pump settings but continues to have variability in blood sugar  Her A1c has previously been stable between 7-7.4%  CURRENT insulin pump:  [employer]onic 670  She has has been on the 670 pump and in the auto mode since 04/10/16   RECENT HISTORY:   PUMP SETTINGS are: Basal rate: Midnight = 0.6; 7 AM = 1.5,  2 PM = 1.0, 6 PM = 0.975, 10 PM = 0.8 Carb Ratio: 1: 9 breakfast and lunch, 1:10 at 5 PM   Correction factor 1: 40 at 6 AM until 2 PM and then 45; 55 overnight, target 110, active insulin 3-1/2 hours  Her A1c has been still above 7% target and ranging between 7.5-7.7    Current blood sugar patterns, management and problems identified:   She has overall similar patterns to her last visit on her blood sugars  Appears to have variable amount of dawn phenomena  As before she is not consistent with bolusing when she is noticing her blood sugar rising or with drinking coffee in the morning when she is usually not eating breakfast  With not consistently estimating how much she is needing to cover for her meals she can still get unusually high or low readings postprandially  Currently overall less active also  Since yesterday she has had difficulties with her transmitter for the sensor    CONTINUOUS GLUCOSE MONITORING RECORD INTERPRETATION    Dates of Recording: 1/29 through 2/11  Sensor description: Guardian sensor  Results statistics:   CGM use % of time  83  Average and SD  151+/-44  Time in range      73 %  % Time Above 180  24  % Time above 250 1  % Time Below  target 2    Glycemic patterns summary: Her highest blood sugars are again around midday starting about noon until about 3 PM on an average and occasional high postprandial readings as discussed below  Hyperglycemic episodes are occurring midmorning most frequently and periodically in the early afternoon or late evening  Hypoglycemic episodes occurred only infrequently 3 AM, 5 PM and midnight with only 3 occurrences.  Most of these appear to be related to excessive boluses either meals or high sugar  Overnight periods: Blood sugars are generally stable. Maybe initially variable around midnight Blood sugars tend to start rising at about 8-9 AM regardless of whether she is eating breakfast or not  Postprandial periods: After breakfast: Have occasional high readings but usually she is not eating breakfast Sugars are probably mostly high before lunch related to her hypoglycemia late morning but then are usually flat   after lunch:    After dinner: Blood sugars are variable but occasionally low normal readings also but not consistently  Blood sugar readings about 3 hours after meals are slightly lower but usually pre-meal blood sugars are starting off high  PRE-MEAL Fasting Lunch Dinner  late p.m. Overall  Glucose range:     AC/PC   Mean/median:  145  181  169  153/145    POST-MEAL PC Breakfast PC Lunch  PC Dinner  Glucose range:     Mean/median: 142  181  136    EXERCISE:She is not doing any exercise and only likes to go walking for shopping   Wt Readings from Last 3 Encounters:  03/17/18 153 lb 3.2 oz (69.5 kg)  11/13/17 153 lb (69.4 kg)  07/02/17 157 lb 12.8 oz (71.6 kg)     Lab Results  Component Value Date   HGBA1C 7.7 (H) 03/12/2018   HGBA1C 7.7 (H) 11/11/2017   HGBA1C 7.5 (H) 06/26/2017   Lab Results  Component Value Date   MICROALBUR 0.7 03/31/2017   LDLCALC 129 (H) 03/12/2018   CREATININE 0.95 03/12/2018    OTHER active problems: See review of systems  Lab on  03/12/2018  Component Date Value Ref Range Status  . Free T4 03/12/2018 1.39  0.60 - 1.60 ng/dL Final   Comment: Specimens from patients who are undergoing biotin therapy and /or ingesting biotin supplements may contain high levels of biotin.  The higher biotin concentration in these specimens interferes with this Free T4 assay.  Specimens that contain high levels  of biotin may cause false high results for this Free T4 assay.  Please interpret results in light of the total clinical presentation of the patient.    Marland Kitchen. TSH 03/12/2018 0.17* 0.35 - 4.50 uIU/mL Final  . Cholesterol 03/12/2018 214* 0 - 200 mg/dL Final   ATP III Classification       Desirable:  < 200 mg/dL               Borderline High:  200 - 239 mg/dL          High:  > = 161240 mg/dL  . Triglycerides 03/12/2018 94.0  0.0 - 149.0 mg/dL Final   Normal:  <096<150 mg/dLBorderline High:  150 - 199 mg/dL  . HDL 03/12/2018 65.40  >39.00 mg/dL Final  . VLDL 04/54/098102/07/2018 18.8  0.0 - 40.0 mg/dL Final  . LDL Cholesterol 03/12/2018 129* 0 - 99 mg/dL Final  . Total CHOL/HDL Ratio 03/12/2018 3   Final                  Men          Women1/2 Average Risk     3.4          3.3Average Risk          5.0          4.42X Average Risk          9.6          7.13X Average Risk          15.0          11.0                      . NonHDL 03/12/2018 148.13   Final   NOTE:  Non-HDL goal should be 30 mg/dL higher than patient's LDL goal (i.e. LDL goal of < 70 mg/dL, would have non-HDL goal of < 100 mg/dL)  . Sodium 03/12/2018 138  135 - 145 mEq/L Final  . Potassium 03/12/2018 5.0  3.5 - 5.1 mEq/L Final  . Chloride 03/12/2018 103  96 - 112 mEq/L Final  . CO2 03/12/2018 30  19 - 32 mEq/L Final  . Glucose, Bld 03/12/2018 163* 70 - 99 mg/dL Final  . BUN 19/14/782902/07/2018 19  6 - 23 mg/dL Final  . Creatinine, Ser 03/12/2018 0.95  0.40 - 1.20 mg/dL  Final  . Total Bilirubin 03/12/2018 0.5  0.2 - 1.2 mg/dL Final  . Alkaline Phosphatase 03/12/2018 87  39 - 117 U/L Final  . AST  03/12/2018 12  0 - 37 U/L Final  . ALT 03/12/2018 8  0 - 35 U/L Final  . Total Protein 03/12/2018 6.7  6.0 - 8.3 g/dL Final  . Albumin 52/84/1324 4.0  3.5 - 5.2 g/dL Final  . Calcium 40/11/2723 9.5  8.4 - 10.5 mg/dL Final  . GFR 36/64/4034 57.77* >60.00 mL/min Final  . Hgb A1c MFr Bld 03/12/2018 7.7* 4.6 - 6.5 % Final   Glycemic Control Guidelines for People with Diabetes:Non Diabetic:  <6%Goal of Therapy: <7%Additional Action Suggested:  >8%     Allergies as of 03/17/2018      Reactions   Atorvastatin    REACTION: joint pains   Influenza Vaccines    Patient got bursitis after her vaccine   Losartan Potassium    REACTION: blurred vision   Metoprolol Succinate    REACTION: trigger fingers, arm pain   Olmesartan Medoxomil    Quinapril Hcl    REACTION: throat swelling      Medication List       Accurate as of March 17, 2018 10:36 AM. Always use your most recent med list.        glucose blood test strip Commonly known as:  CONTOUR NEXT TEST Use as instructed to check blood sugar 5 times daily. DX:E10.565   CONTOUR NEXT TEST test strip Generic drug:  glucose blood USE AS INSTRUCTED TO CHECK  BLOOD SUGAR 5 TIMES DAILY   insulin lispro 100 UNIT/ML injection Commonly known as:  HUMALOG INJECT 60 UNITS INTO THE SKIN DAILY   levothyroxine 100 MCG tablet Commonly known as:  SYNTHROID, LEVOTHROID TAKE 1 TABLET(100 MCG) BY MOUTH DAILY       Allergies:  Allergies  Allergen Reactions  . Atorvastatin     REACTION: joint pains  . Influenza Vaccines     Patient got bursitis after her vaccine  . Losartan Potassium     REACTION: blurred vision  . Metoprolol Succinate     REACTION: trigger fingers, arm pain  . Olmesartan Medoxomil   . Quinapril Hcl     REACTION: throat swelling    Past Medical History:  Diagnosis Date  . ANXIETY 10/28/2007  . BACK PAIN 12/23/2007  . CAROTID BRUIT 07/28/2009  . CORONARY ARTERY DISEASE 07/22/2007  . DIABETES MELLITUS, TYPE I 07/22/2007   . GERD 12/23/2007  . HAIR LOSS 02/17/2009  . HYPERLIPIDEMIA 07/22/2007  . HYPERTENSION 07/22/2007  . HYPOTHYROIDISM 07/22/2007  . SKIN LESION 10/27/2007  . URI 12/23/2007    Past Surgical History:  Procedure Laterality Date  . CATARACT EXTRACTION    . CESAREAN SECTION     x2  . CORONARY ARTERY BYPASS GRAFT    . EYE SURGERY    . s/p coronary stent    . TUBAL LIGATION      Family History  Problem Relation Age of Onset  . Hypertension Mother   . Melanoma Mother   . Alcohol abuse Sister   . Arthritis Maternal Grandmother   . Diabetes Maternal Grandmother   . Heart disease Neg Hx     Social History:  reports that she has never smoked. She has never used smokeless tobacco. She reports that she does not drink alcohol or use drugs.  REVIEW of systems:  Eye exams with Dr. Luciana Axe , has retinopathy, Stable.   HYPOTHYROIDISM:  She has had long-standing primary hypothyroidism and previously has been on either Armour Thyroid or levothyroxine. She thinks she did not tolerate Armour Thyroid.  She has been taking 100 g , previously 88 mcg   She feels fairly good with no unusual fatigue does not think she has any palpitations  With taking her medication in the morning she would tend to forget and is taking this at night as before  TSH is now slightly below normal:   Lab Results  Component Value Date   TSH 0.17 (L) 03/12/2018   TSH 0.95 11/11/2017   TSH 3.71 03/31/2017   FREET4 1.39 03/12/2018   FREET4 1.04 11/11/2017   FREET4 0.93 03/31/2017     Borderline hypertension She has had mildly increased blood pressure readings especially systolic She does not want to take any medications, this has been recommended by her PCP also Blood pressure high today done twice Not checking at home  BP Readings from Last 3 Encounters:  03/17/18 (!) 160/78  11/13/17 140/80  07/02/17 134/82      Hyperlipidemia:  On several of her previous visits she has agreed to restart taking Zocor but  again has not done this She is consistently refusing to consider medications for treatment despite explaining the benefits of risk reduction regardless of cholesterol levels Last LDL 114 and now 129   Lab Results  Component Value Date   CHOL 214 (H) 03/12/2018   HDL 65.40 03/12/2018   LDLCALC 129 (H) 03/12/2018   LDLDIRECT 114.0 06/26/2017   TRIG 94.0 03/12/2018   CHOLHDL 3 03/12/2018     EXAM:  BP (!) 160/78 (BP Location: Left Arm, Patient Position: Sitting, Cuff Size: Normal)   Pulse 84   Ht 5\' 4"  (1.626 m)   Wt 153 lb 3.2 oz (69.5 kg)   SpO2 98%   BMI 26.30 kg/m    ASSESSMENT:  DIABETES type 1:  See history of present illness for detailed discussion of current management, blood sugar patterns and problems identified  A1c is still relatively high at 7.7  Her blood sugars are averaging about 150 and most of her readings are within the target range Hypoglycemia has been minimal and only occasionally from getting too much insulin on boluses Hyperglycemia is either with meals are in the mornings with dawn phenomenon or when she does not cover her coffee in the morning No consistent pattern seen except a tendency to lower blood sugars about 3 hours after boluses   She had the same patterns may consider increasing her active insulin time to 4 hours on the next visit  She was reminded to bolus consistently before eating and also in the mornings at least do this 10 to 15 minutes before the meal  Discussed that she does not have to hold back on eating a meal if her blood sugar is high to start with  She should bolus at least half a unit more for coffee in the morning  Also her blood sugars are rising in the morning she will need to bolus as a correction  Follow-up with A1c in 3 months  LIPIDS: She was explained the need for taking [place] statin drug but she refuses   HYPOTHYROIDISM: Her TSH is low prefers to try the brand name if not too expensive compared to generic She  will switch to the 88 mcg dose  Counseling time on subjects discussed in assessment and plan sections is over 50% of today's 25 minute visit   There are  no Patient Instructions on file for this visit.   Reather LittlerAjay Jubilee Vivero 03/17/2018, 10:36 AM

## 2018-03-17 NOTE — Patient Instructions (Signed)
Bolus in am for coffee or rising sugars

## 2018-03-18 ENCOUNTER — Telehealth: Payer: Self-pay | Admitting: Endocrinology

## 2018-03-18 NOTE — Telephone Encounter (Signed)
[employer] representative came in today with this paperwork. It was filled out, signed by the MD and given back to the rep. Pt called and made aware.

## 2018-03-18 NOTE — Telephone Encounter (Signed)
Patient stated she is expecting a call back from [facility] about some paperwork that was going to be faxed over to our office,. she said she left a message this morning requesting this call but has not heard back from anyone and this needed to be taken care of today.    Please advise

## 2018-04-08 LAB — HM DIABETES EYE EXAM

## 2018-04-10 ENCOUNTER — Telehealth: Payer: Self-pay | Admitting: Endocrinology

## 2018-04-10 ENCOUNTER — Other Ambulatory Visit: Payer: Self-pay | Admitting: Endocrinology

## 2018-04-10 MED ORDER — INSULIN GLARGINE 100 UNIT/ML ~~LOC~~ SOLN: 10.0000 [IU] | Freq: Two times a day (BID) | SUBCUTANEOUS | 0 refills | Status: DC

## 2018-04-10 NOTE — Telephone Encounter (Signed)
Patient called stated that he pump has broken,  Was given advice from  nurse to contact her pump manufacturer for breakage information.  Patient stated "that she does not have protocol for injectables if pump doesn't get replaced, in a timely fashion and that she needs pump settings when new pump is received if received"

## 2018-04-10 NOTE — Telephone Encounter (Signed)
Can you please give injectable directions?

## 2018-04-10 NOTE — Telephone Encounter (Signed)
If she has Lantus or other basal insulin at home she can take 10 units twice a day otherwise need to send prescription for Lantus vial.  She will also take her Humalog or NovoLog with each meal based on carbohydrate intake and blood sugar level as she would do with the pump.  If she needs higher pump settings please have her note them down from my last note

## 2018-04-13 NOTE — Telephone Encounter (Signed)
Called pt and informed her of this information. Pt stated that Dr. Lucianne Muss had contacted her after hours and informed her of this. Pt stated that she ended up not needing do inject insulin over the weekend because [employer] sent her a replacement part overnight.

## 2018-04-22 ENCOUNTER — Other Ambulatory Visit: Payer: Self-pay

## 2018-04-22 MED ORDER — LEVOTHYROXINE SODIUM 88 MCG PO TABS: 88.0000 ug | ORAL_TABLET | Freq: Every day | ORAL | 1 refills | Status: DC

## 2018-04-23 ENCOUNTER — Other Ambulatory Visit: Payer: Self-pay | Admitting: Endocrinology

## 2018-05-20 ENCOUNTER — Telehealth: Payer: Self-pay | Admitting: Endocrinology

## 2018-05-20 NOTE — Telephone Encounter (Signed)
Did we receive the the fax from [employer] for the sensors, can we please change the number of boxes from 3 to 4 boxes please

## 2018-05-21 ENCOUNTER — Telehealth: Payer: Self-pay

## 2018-05-21 NOTE — Telephone Encounter (Signed)
Patient called today and is requesting a call back from [facility] to get the prescription for medtronic resolved

## 2018-05-21 NOTE — Telephone Encounter (Signed)
Called pt and she stated that she typically gets 4 boxes of sensors, but now she is only getting 3. She states that she gets her supplies directly from [employer]. [employer] Rep was contact to request the proper paperwork in order to be filled out.

## 2018-06-11 ENCOUNTER — Other Ambulatory Visit: Payer: Commercial Managed Care - PPO

## 2018-06-16 ENCOUNTER — Ambulatory Visit: Payer: Commercial Managed Care - PPO | Admitting: Endocrinology

## 2018-07-09 ENCOUNTER — Telehealth: Payer: Self-pay

## 2018-07-09 NOTE — Telephone Encounter (Signed)
LOV 03/17/18. Per Dr. Lucianne Muss, f/u in 3 mo. Canceled 06/16/18 appt with Dr. Lucianne Muss and lab appt on 06/11/18. Called pt to reschedule appts. LVM requesting returned call.

## 2018-07-17 ENCOUNTER — Other Ambulatory Visit (INDEPENDENT_AMBULATORY_CARE_PROVIDER_SITE_OTHER): Payer: Commercial Managed Care - PPO

## 2018-07-17 ENCOUNTER — Other Ambulatory Visit: Payer: Self-pay

## 2018-07-17 DIAGNOSIS — E063 Autoimmune thyroiditis: Secondary | ICD-10-CM | POA: Diagnosis not present

## 2018-07-17 DIAGNOSIS — E1065 Type 1 diabetes mellitus with hyperglycemia: Secondary | ICD-10-CM | POA: Diagnosis not present

## 2018-07-17 LAB — COMPREHENSIVE METABOLIC PANEL
ALT: 10 U/L (ref 0–35)
AST: 15 U/L (ref 0–37)
Albumin: 4.4 g/dL (ref 3.5–5.2)
Alkaline Phosphatase: 64 U/L (ref 39–117)
BUN: 14 mg/dL (ref 6–23)
CO2: 25 mEq/L (ref 19–32)
Calcium: 9.5 mg/dL (ref 8.4–10.5)
Chloride: 100 mEq/L (ref 96–112)
Creatinine, Ser: 0.95 mg/dL (ref 0.40–1.20)
GFR: 57.71 mL/min — ABNORMAL LOW (ref >60.00)
Glucose, Bld: 156 mg/dL — ABNORMAL HIGH (ref 70–99)
Potassium: 4.4 mEq/L (ref 3.5–5.1)
Sodium: 135 mEq/L (ref 135–145)
Total Bilirubin: 0.6 mg/dL (ref 0.2–1.2)
Total Protein: 7.3 g/dL (ref 6.0–8.3)

## 2018-07-17 LAB — HEMOGLOBIN A1C: Hgb A1c MFr Bld: 8.1 % — ABNORMAL HIGH (ref 4.6–6.5)

## 2018-07-17 LAB — TSH: TSH: 11.8 u[IU]/mL — ABNORMAL HIGH (ref 0.35–4.50)

## 2018-07-17 LAB — T4, FREE: Free T4: 0.9 ng/dL (ref 0.60–1.60)

## 2018-07-27 ENCOUNTER — Other Ambulatory Visit: Payer: Self-pay

## 2018-07-27 MED ORDER — LEVOTHYROXINE SODIUM 100 MCG PO TABS: 100.0000 ug | ORAL_TABLET | Freq: Every day | ORAL | 1 refills | Status: DC

## 2018-07-27 NOTE — Telephone Encounter (Signed)
FYI

## 2018-08-03 ENCOUNTER — Other Ambulatory Visit: Payer: Self-pay

## 2018-08-03 ENCOUNTER — Encounter: Payer: Self-pay | Admitting: Endocrinology

## 2018-08-03 ENCOUNTER — Ambulatory Visit (INDEPENDENT_AMBULATORY_CARE_PROVIDER_SITE_OTHER): Payer: Commercial Managed Care - PPO | Admitting: Endocrinology

## 2018-08-03 DIAGNOSIS — E7800 Pure hypercholesterolemia, unspecified: Secondary | ICD-10-CM

## 2018-08-03 DIAGNOSIS — E1065 Type 1 diabetes mellitus with hyperglycemia: Secondary | ICD-10-CM

## 2018-08-03 DIAGNOSIS — E063 Autoimmune thyroiditis: Secondary | ICD-10-CM | POA: Diagnosis not present

## 2018-08-03 NOTE — Progress Notes (Addendum)
Patient ID: Stacy SplinterLinda Cain, female   DOB: 12/18/1945, 73 y.o.   MRN: [id number]   Reason for Appointment: Endocrinology followup:   Today's office visit was provided via telemedicine using video technique The patient was explained the limitations of evaluation and management by telemedicine and the availability of in person appointments.  The patient understood the limitations and agreed to proceed. Patient also understood that the telehealth visit is billable. . Location of the patient: Patient's home . Location of the provider: Physician office Only the patient and myself were participating in the encounter    History of Present Illness   Diagnosis: Type 1 DIABETES MELITUS, date of diagnosis:  1974  DIABETES history: She has been on an insulin pump for several years with variable control. She is fairly compliant with monitoring her blood sugars and doing some adjustments of her pump settings but continues to have variability in blood sugar  Her A1c has previously been stable between 7-7.4%  CURRENT insulin pump:  Medtronic 670  She has has been on the 670 pump and in the auto mode since 04/10/16   RECENT HISTORY:   PUMP SETTINGS are: Basal rate: Midnight = 0.6; 7 AM = 1.5,  2 PM = 1.0, 6 PM = 0.975, 10 PM = 0.8 Carb Ratio: 1: 9 breakfast and lunch, 1:10 at 5 PM   Correction factor 1: 40 at 6 AM until 2 PM and then 1: 45; 55 overnight, target 110, active insulin 3-1/2 hours  Her A1c has been above 7% target and ranging between 7.5-7.7  It is now 8.1  Current blood sugar patterns, management and problems identified:   She believes her sugars are probably higher because of not doing any walking or being as active  As before her most significant hyperglycemia is usually in the morning hours even though it is not consistent from day-to-day  He has been told to take at least 1-2 units in the morning regardless of whether she is eating breakfast or not and may probably have  high readings when she does not do this; she does not think her higher sugars by midday are related to drinking coffee  Has been no technical difficulties with her pump or sensor recently  Most common reason for exiting auto mode is max delivery and occasionally minimum delivery  Her settings were not changed on her last visit    CONTINUOUS GLUCOSE MONITORING RECORD INTERPRETATION    Dates of Recording: 07/20/2018 through 6/28  Sensor description: Guardian  Results statistics:   CGM use % of time  88  Average and SD  154+/-52  Time in range  69     %  % Time Above 180  24   % Time above 250 4  % Time Below target  3    Glycemic patterns summary: Blood sugar variability is reasonably moderate and mostly noted in the early afternoon and late evening Overnight blood sugars are stable and are the lowest between 4-5 AM No significant hypoglycemia and hyperglycemia is most prominent around 11 AM  Hyperglycemic episodes are occurring in the morning hours frequently to a variable extent and with or without any meal intake Occasionally will have hypoglycemia when he is not in any auto mode and randomly at other times, noted at 2 PM and 11 PM  Hypoglycemic episodes are minimal, once from overcorrection for high readings during the night as well as occasionally overestimating boluses at lunch or dinner  Overnight periods: Unknown average blood  sugars are near 180 at midnight and decline progressively until leveling off between about 4-7 AM Blood sugars are gradually rising around 7-8 AM prior to her waking up  Preprandial periods:  Previously not eating breakfast She has a variable trend of blood sugar rising progressively between 8 AM until 11 AM with or without a bolus Before lunch blood sugars are higher to start with an average at 170 Before DINNER blood sugars are variably high at about 154 average  Postprandial periods:   After breakfast: He did not have enough boluses to  detect a pattern but may be slightly higher at times  After lunch:   Blood sugar is generally the same or lower  After dinner: Blood sugar response is variable with sometimes blood sugar going up with foods like pasta but otherwise may be normal or low   PRE-MEAL Fasting Lunch Dinner Bedtime Overall  Glucose range:       Mean/median:  189  170  154     POST-MEAL PC Breakfast PC Lunch PC Dinner  Glucose range:     Mean/median:  175  160  157    EXERCISE:She is not doing any exercise and only likes to go walking for shopping   Wt Readings from Last 3 Encounters:  03/17/18 153 lb 3.2 oz (69.5 kg)  11/13/17 153 lb (69.4 kg)  07/02/17 157 lb 12.8 oz (71.6 kg)     Lab Results  Component Value Date   HGBA1C 8.1 (H) 07/17/2018   HGBA1C 7.7 (H) 03/12/2018   HGBA1C 7.7 (H) 11/11/2017   Lab Results  Component Value Date   MICROALBUR 0.7 03/31/2017   LDLCALC 129 (H) 03/12/2018   CREATININE 0.95 07/17/2018    OTHER active problems: See review of systems  No visits with results within 1 Week(s) from this visit.  Latest known visit with results is:  Lab on 07/17/2018  Component Date Value Ref Range Status  . Free T4 07/17/2018 0.90  0.60 - 1.60 ng/dL Final   Comment: Specimens from patients who are undergoing biotin therapy and /or ingesting biotin supplements may contain high levels of biotin.  The higher biotin concentration in these specimens interferes with this Free T4 assay.  Specimens that contain high levels  of biotin may cause false high results for this Free T4 assay.  Please interpret results in light of the total clinical presentation of the patient.    Marland Kitchen TSH 07/17/2018 11.80* 0.35 - 4.50 uIU/mL Final  . Sodium 07/17/2018 135  135 - 145 mEq/L Final  . Potassium 07/17/2018 4.4  3.5 - 5.1 mEq/L Final  . Chloride 07/17/2018 100  96 - 112 mEq/L Final  . CO2 07/17/2018 25  19 - 32 mEq/L Final  . Glucose, Bld 07/17/2018 156* 70 - 99 mg/dL Final  . BUN 07/17/2018 14  6 -  23 mg/dL Final  . Creatinine, Ser 07/17/2018 0.95  0.40 - 1.20 mg/dL Final  . Total Bilirubin 07/17/2018 0.6  0.2 - 1.2 mg/dL Final  . Alkaline Phosphatase 07/17/2018 64  39 - 117 U/L Final  . AST 07/17/2018 15  0 - 37 U/L Final  . ALT 07/17/2018 10  0 - 35 U/L Final  . Total Protein 07/17/2018 7.3  6.0 - 8.3 g/dL Final  . Albumin 07/17/2018 4.4  3.5 - 5.2 g/dL Final  . Calcium 07/17/2018 9.5  8.4 - 10.5 mg/dL Final  . GFR 07/17/2018 57.71* >60.00 mL/min Final  . Hgb A1c MFr Bld 07/17/2018 8.1*  4.6 - 6.5 % Final   Glycemic Control Guidelines for People with Diabetes:Non Diabetic:  <6%Goal of Therapy: <7%Additional Action Suggested:  >8%     Allergies as of 08/03/2018      Reactions   Atorvastatin    REACTION: joint pains   Influenza Vaccines    Patient got bursitis after her vaccine   Losartan Potassium    REACTION: blurred vision   Metoprolol Succinate    REACTION: trigger fingers, arm pain   Olmesartan Medoxomil    Quinapril Hcl    REACTION: throat swelling      Medication List       Accurate as of August 03, 2018  1:32 PM. If you have any questions, ask your nurse or doctor.        glucose blood test strip Commonly known as: Contour Next Test Use as instructed to check blood sugar 5 times daily. DX:E10.565   Contour Next Test test strip Generic drug: glucose blood USE AS INSTRUCTED TO CHECK  BLOOD SUGAR 5 TIMES DAILY   insulin glargine 100 UNIT/ML injection Commonly known as: Lantus Inject 0.1 mLs (10 Units total) into the skin 2 (two) times daily.   insulin lispro 100 UNIT/ML injection Commonly known as: HumaLOG INJECT 60 UNITS DAILY VIA  INSULIN PUMP   levothyroxine 100 MCG tablet Commonly known as: SYNTHROID Take 1 tablet (100 mcg total) by mouth daily before breakfast. Take 1 tablet by mouth once daily.       Allergies:  Allergies  Allergen Reactions  . Atorvastatin     REACTION: joint pains  . Influenza Vaccines     Patient got bursitis after her  vaccine  . Losartan Potassium     REACTION: blurred vision  . Metoprolol Succinate     REACTION: trigger fingers, arm pain  . Olmesartan Medoxomil   . Quinapril Hcl     REACTION: throat swelling    Past Medical History:  Diagnosis Date  . ANXIETY 10/28/2007  . BACK PAIN 12/23/2007  . CAROTID BRUIT 07/28/2009  . CORONARY ARTERY DISEASE 07/22/2007  . DIABETES MELLITUS, TYPE I 07/22/2007  . GERD 12/23/2007  . HAIR LOSS 02/17/2009  . HYPERLIPIDEMIA 07/22/2007  . HYPERTENSION 07/22/2007  . HYPOTHYROIDISM 07/22/2007  . SKIN LESION 10/27/2007  . URI 12/23/2007    Past Surgical History:  Procedure Laterality Date  . CATARACT EXTRACTION    . CESAREAN SECTION     x2  . CORONARY ARTERY BYPASS GRAFT    . EYE SURGERY    . s/p coronary stent    . TUBAL LIGATION      Family History  Problem Relation Age of Onset  . Hypertension Mother   . Melanoma Mother   . Alcohol abuse Sister   . Arthritis Maternal Grandmother   . Diabetes Maternal Grandmother   . Heart disease Neg Hx     Social History:  reports that she has never smoked. She has never used smokeless tobacco. She reports that she does not drink alcohol or use drugs.  REVIEW of systems:  Eye exams with Dr. Luciana Axeankin , has retinopathy.   HYPOTHYROIDISM: She has had long-standing primary hypothyroidism and previously has been on either Armour Thyroid or levothyroxine. She thinks she did not tolerate Armour Thyroid.   She has been taking 88 mcg levothyroxine again, on this dose since 2/21 her TSH was 0.17   She does not complain of any recent increased fatigue  With taking her medication in the morning she would  tend to forget and is taking this at night as before Does not think she has forgotten her medication recently  TSH which was previously slightly below normal is much higher at 11.8:   Lab Results  Component Value Date   TSH 11.80 (H) 07/17/2018   TSH 0.17 (L) 03/12/2018   TSH 0.95 11/11/2017   FREET4 0.90 07/17/2018    FREET4 1.39 03/12/2018   FREET4 1.04 11/11/2017     Borderline hypertension She has had mildly increased blood pressure readings especially systolic She does not want to take any medications, this has been recommended by her PCP also  Has not had blood pressure checked recently even at home  BP Readings from Last 3 Encounters:  03/17/18 (!) 160/78  11/13/17 140/80  07/02/17 134/82      Hyperlipidemia:  On several of her previous visits she has agreed to restart taking Zocor but again has not done this She is consistently refusing to consider medications for treatment despite explaining the benefits of risk reduction regardless of cholesterol levels Last LDL  129  Lab Results  Component Value Date   CHOL 214 (H) 03/12/2018   CHOL 241 (H) 03/31/2017   CHOL 219 (H) 12/30/2016   Lab Results  Component Value Date   HDL 65.40 03/12/2018   HDL 69.40 03/31/2017   HDL 67.70 12/30/2016   Lab Results  Component Value Date   LDLCALC 129 (H) 03/12/2018   LDLCALC 159 (H) 03/31/2017   LDLCALC 136 (H) 12/30/2016   Lab Results  Component Value Date   TRIG 94.0 03/12/2018   TRIG 62.0 03/31/2017   TRIG 78.0 12/30/2016   Lab Results  Component Value Date   CHOLHDL 3 03/12/2018   CHOLHDL 3 03/31/2017   CHOLHDL 3 12/30/2016   Lab Results  Component Value Date   LDLDIRECT 114.0 06/26/2017   LDLDIRECT 137.3 02/24/2013   LDLDIRECT 152.6 10/20/2012       EXAM:  There were no vitals taken for this visit.   ASSESSMENT:  DIABETES type 1:  See history of present illness for detailed discussion of current management, blood sugar patterns and problems identified  A1c is still higher than before at 8.1  Her blood sugars are averaging about 150 again and cannot explain why her A1c is higher Highest blood sugars are midday and she still appears to have a strong but variable amount of dawn phenomena related hyperglycemia in the mornings Not clear why her auto mode does not  correct her hyperglycemia adequately in the morning hours with basal adjustment Despite reminding her to bolus whether she is eating breakfast or not she forgets to do this Also her response to boluses at lunch and dinner is variable and has periodic high readings late at night also She also thinks her high sugars may be related to lack of any exercise  She will try to walk regularly Bolus 1.5 units in the morning in addition to what ever she is covering as a breakfast bolus and even if blood sugars are normal to bolus anyway unless blood sugars are dropping Additional 2 to 4 units for higher fat meals or foods like pasta Bolus before eating consistently Recommend trying a carbohydrate ratio 1: 8 in the morning  Follow-up with A1c in 3-4 months  LIPIDS: She has not had these checked because of reluctance to take treatment anyway  She thinks she may consider starting the treatment but wants to have more labs done   HYPOTHYROIDISM: Her TSH is  inconsistent This may be partly related to her taking her levothyroxine at bedtime instead of morning when she would forget TSH is high but she appears asymptomatic currently  She has started taking 100 mcg recently but is agreeable to try the brand name on the next prescription Also needs to have follow-up TSH done about 6 weeks from now, she will get it done at a local lab since she is out of town currently  Counseling time on subjects discussed in assessment and plan sections is over 50% of today's 25 minute visit   There are no Patient Instructions on file for this visit.   Reather LittlerAjay Carry Weesner 08/03/2018, 1:32 PM

## 2018-08-21 ENCOUNTER — Other Ambulatory Visit: Payer: Self-pay | Admitting: Endocrinology

## 2018-10-16 ENCOUNTER — Telehealth: Payer: Self-pay | Admitting: Endocrinology

## 2018-10-16 MED ORDER — LEVOTHYROXINE SODIUM 100 MCG PO TABS: 100.0000 ug | ORAL_TABLET | Freq: Every day | ORAL | 0 refills | Status: DC

## 2018-10-16 NOTE — Telephone Encounter (Signed)
Patient called and stated [facility] should have been faxing [place] last week in regards to synthroid, calling for an update.  Please Advise, Thanks

## 2018-10-16 NOTE — Telephone Encounter (Signed)
Paperwork was just received from [facility] regarding pt's Synthroid refill request. Refill has been sent electronically to this pharmacy.

## 2018-10-16 NOTE — Telephone Encounter (Signed)
At this time, this paperwork has not been received.

## 2018-11-03 ENCOUNTER — Other Ambulatory Visit: Payer: Commercial Managed Care - PPO

## 2018-11-06 ENCOUNTER — Ambulatory Visit: Payer: Commercial Managed Care - PPO | Admitting: Endocrinology

## 2018-12-11 ENCOUNTER — Other Ambulatory Visit: Payer: Self-pay | Admitting: Endocrinology

## 2018-12-24 ENCOUNTER — Other Ambulatory Visit: Payer: Self-pay | Admitting: Endocrinology

## 2019-04-30 ENCOUNTER — Encounter: Payer: Self-pay | Admitting: Internal Medicine

## 2019-05-21 ENCOUNTER — Other Ambulatory Visit: Payer: Self-pay | Admitting: Endocrinology

## 2019-06-07 ENCOUNTER — Other Ambulatory Visit: Payer: Self-pay

## 2019-06-07 ENCOUNTER — Encounter: Payer: Self-pay | Admitting: Endocrinology

## 2019-06-07 ENCOUNTER — Telehealth (INDEPENDENT_AMBULATORY_CARE_PROVIDER_SITE_OTHER): Payer: Managed Care, Other (non HMO) | Admitting: Endocrinology

## 2019-06-07 DIAGNOSIS — E063 Autoimmune thyroiditis: Secondary | ICD-10-CM

## 2019-06-07 DIAGNOSIS — E7800 Pure hypercholesterolemia, unspecified: Secondary | ICD-10-CM

## 2019-06-07 DIAGNOSIS — E1065 Type 1 diabetes mellitus with hyperglycemia: Secondary | ICD-10-CM | POA: Diagnosis not present

## 2019-06-07 MED ORDER — LEVOTHYROXINE SODIUM 100 MCG PO TABS: 100.0000 ug | ORAL_TABLET | Freq: Every day | ORAL | 0 refills | Status: DC

## 2019-06-07 NOTE — Progress Notes (Signed)
Patient ID: Stacy Cain, female   DOB: March 21, 1945, 74 y.o.   MRN: [id number]   Reason for Appointment: Endocrinology followup:   I connected with the above-named patient by video enabled telemedicine application and verified that I am speaking with the correct person. The patient was explained the limitations of evaluation and management by telemedicine and the availability of in person appointments.  Patient also understood that there may be a patient responsible charge related to this service  Location of the patient: Patient's home  Location of the provider: Physician office Only the patient and myself were participating in the encounter The patient understood the above statements and agreed to proceed.   History of Present Illness   Diagnosis: Type 1 DIABETES MELITUS, date of diagnosis:  1974  DIABETES history: She has been on an insulin pump for several years with variable control. She is fairly compliant with monitoring her blood sugars and doing some adjustments of her pump settings but continues to have variability in blood sugar  Her A1c has previously been stable between 7-7.4%  CURRENT insulin pump:  Medtronic 670  She has has been on the 670 pump and in the auto mode since 04/10/16   RECENT HISTORY:   PUMP SETTINGS are: Basal rate: Midnight = 0.6; 7 AM = 1.5,  2 PM = 1.0, 6 PM = 0.975, 10 PM = 0.8 Carb Ratio: 1: 8 at breakfast 1: 9 and lunch, 1:10 at 5 PM   Correction factor 1: 40 at 6 AM until 2 PM and then 1: 45; 1: 55 overnight, target 110, active insulin 3-1/2 hours  Her A1c has been above 7% target and ranging between 7.5-7.7  It is last 8.1 as of 07/2018 when she was last seen  Current blood sugar patterns, management and problems identified:   She has not been able to establish with an endocrinologist in [place] where she is living now  CGM interpretation as discussed below with abnormal patterns  She does try to bolus right before eating  although analysis of her CGM indicates sometimes she may not bolus until starting to eat  Because of her high readings in the morning hours despite not eating she has been told to take extra boluses of 1-2 units in the mornings when sugars are rising but she does not always do so  She thinks that she is still having some issues with her sensor is especially recently and some of the filaments may get bent on insertion  Most common reason for exiting auto mode is max delivery and occasionally minimum delivery  She does not exercise  AUTO mode 84% of the time Reasons for exit most commonly auto mode maximum delivery, no calibration, high blood sugar and once blood sugar required  CONTINUOUS GLUCOSE MONITORING RECORD INTERPRETATION    Dates of Recording: Last 2 weeks  Sensor description: Guardian  Results statistics:   CGM use % of time  79  Average and SD  160+/-54  Time in range    67    %  % Time Above 180  24  % Time above 250 7  % Time Below target  2    PRE-MEAL Fasting Lunch Dinner Bedtime Overall  Glucose range:       Mean/median:  144  207  171     POST-MEAL PC Breakfast PC Lunch PC Dinner  Glucose range:     Mean/median:  119  216  169    Glycemic patterns summary:  Blood sugars are overall rising after about 8 AM in the morning and progressively go up until about 2 PM on an average.  There is however variability from day-to-day. Blood sugars are mostly on an average higher than target until at least 10 PM.  Overnight blood sugars slightly higher to start with but subsequently improved after 1 AM  Hyperglycemic episodes are occurring primarily in the mornings or after certain meals as well as when she is not in the auto mode.  Hypoglycemic episodes are minimal, low sugar occurred after dinner bolus from 4/23 and also on 4/24 likely from over bolusing  Overnight periods: Blood sugars are averaging about 160 at midnight and start gradually decreasing after 1 AM and  declined progressively after 1 AM with reduced variability   Preprandial periods: Usually not eating breakfast At lunchtime blood sugars are mostly high with some variability At dinnertime blood sugars are also averaging high but inconsistent  Postprandial periods:     After lunch: Blood sugars are tending to rise at about 1 hour after boluses but usually down by 2 hours with the same average as before eating  After dinner: Blood sugars are relatively flat on an average in the first hour but tending to be low normal by 3 hours  Previous data:  CGM use % of time  88  Average and SD  154+/-52  Time in range  69     %  % Time Above 180  24   % Time above 250 4  % Time Below target  3    EXERCISE:She is not doing any exercise and only likes to go walking for shopping   Wt Readings from Last 3 Encounters:  03/17/18 153 lb 3.2 oz (69.5 kg)  11/13/17 153 lb (69.4 kg)  07/02/17 157 lb 12.8 oz (71.6 kg)     Lab Results  Component Value Date   HGBA1C 8.1 (H) 07/17/2018   HGBA1C 7.7 (H) 03/12/2018   HGBA1C 7.7 (H) 11/11/2017   Lab Results  Component Value Date   MICROALBUR 0.7 03/31/2017   LDLCALC 129 (H) 03/12/2018   CREATININE 0.95 07/17/2018    OTHER active problems: See review of systems  No visits with results within 1 Week(s) from this visit.  Latest known visit with results is:  Lab on 07/17/2018  Component Date Value Ref Range Status   Free T4 07/17/2018 0.90  0.60 - 1.60 ng/dL Final   Comment: Specimens from patients who are undergoing biotin therapy and /or ingesting biotin supplements may contain high levels of biotin.  The higher biotin concentration in these specimens interferes with this Free T4 assay.  Specimens that contain high levels  of biotin may cause false high results for this Free T4 assay.  Please interpret results in light of the total clinical presentation of the patient.     TSH 07/17/2018 11.80* 0.35 - 4.50 uIU/mL Final   Sodium 07/17/2018  135  135 - 145 mEq/L Final   Potassium 07/17/2018 4.4  3.5 - 5.1 mEq/L Final   Chloride 07/17/2018 100  96 - 112 mEq/L Final   CO2 07/17/2018 25  19 - 32 mEq/L Final   Glucose, Bld 07/17/2018 156* 70 - 99 mg/dL Final   BUN 02/63/7858 14  6 - 23 mg/dL Final   Creatinine, Ser 07/17/2018 0.95  0.40 - 1.20 mg/dL Final   Total Bilirubin 07/17/2018 0.6  0.2 - 1.2 mg/dL Final   Alkaline Phosphatase 07/17/2018 64  39 - 117  U/L Final   AST 07/17/2018 15  0 - 37 U/L Final   ALT 07/17/2018 10  0 - 35 U/L Final   Total Protein 07/17/2018 7.3  6.0 - 8.3 g/dL Final   Albumin 56/43/3295 4.4  3.5 - 5.2 g/dL Final   Calcium 18/84/1660 9.5  8.4 - 10.5 mg/dL Final   GFR 63/02/6008 57.71* >60.00 mL/min Final   Hgb A1c MFr Bld 07/17/2018 8.1* 4.6 - 6.5 % Final   Glycemic Control Guidelines for People with Diabetes:Non Diabetic:  <6%Goal of Therapy: <7%Additional Action Suggested:  >8%     Allergies as of 06/07/2019      Reactions   Atorvastatin    REACTION: joint pains   Influenza Vaccines    Patient got bursitis after her vaccine   Losartan Potassium    REACTION: blurred vision   Metoprolol Succinate    REACTION: trigger fingers, arm pain   Olmesartan Medoxomil    Quinapril Hcl    REACTION: throat swelling      Medication List       Accurate as of Jun 07, 2019  4:08 PM. If you have any questions, ask your nurse or doctor.        STOP taking these medications   insulin glargine 100 UNIT/ML injection Commonly known as: Lantus Stopped by: Reather Littler, MD     TAKE these medications   Contour Next Test test strip Generic drug: glucose blood USE AS INSTRUCTED TO CHECK  BLOOD SUGAR 5 TIMES DAILY   insulin lispro 100 UNIT/ML injection Commonly known as: HumaLOG INJECT SUBCUTANEOUSLY VIA  INSULIN PUMP 60 UNITS DAILY   Synthroid 100 MCG tablet Generic drug: levothyroxine TAKE 1 TABLET DAILY BEFORE BREAKFAST       Allergies:  Allergies  Allergen Reactions   Atorvastatin      REACTION: joint pains   Influenza Vaccines     Patient got bursitis after her vaccine   Losartan Potassium     REACTION: blurred vision   Metoprolol Succinate     REACTION: trigger fingers, arm pain   Olmesartan Medoxomil    Quinapril Hcl     REACTION: throat swelling    Past Medical History:  Diagnosis Date   ANXIETY 10/28/2007   BACK PAIN 12/23/2007   CAROTID BRUIT 07/28/2009   CORONARY ARTERY DISEASE 07/22/2007   DIABETES MELLITUS, TYPE I 07/22/2007   GERD 12/23/2007   HAIR LOSS 02/17/2009   HYPERLIPIDEMIA 07/22/2007   HYPERTENSION 07/22/2007   HYPOTHYROIDISM 07/22/2007   SKIN LESION 10/27/2007   URI 12/23/2007    Past Surgical History:  Procedure Laterality Date   CATARACT EXTRACTION     CESAREAN SECTION     x2   CORONARY ARTERY BYPASS GRAFT     EYE SURGERY     s/p coronary stent     TUBAL LIGATION      Family History  Problem Relation Age of Onset   Hypertension Mother    Melanoma Mother    Alcohol abuse Sister    Arthritis Maternal Grandmother    Diabetes Maternal Grandmother    Heart disease Neg Hx     Social History:  reports that she has never smoked. She has never used smokeless tobacco. She reports that she does not drink alcohol or use drugs.  REVIEW of systems:  Eye exams has been done but no reports available  HYPOTHYROIDISM: She has had long-standing primary hypothyroidism and previously has been on either Armour Thyroid or levothyroxine. She thinks she did  not tolerate Armour Thyroid.   She has been taking 100 mcg levothyroxine since her last visit in 07/2018 and has not had any follow-up  She feels fairly good  With taking her medication in the morning she would tend to forget and is taking this at night as before She has taken her medication regularly  TSH was last 11.8   Lab Results  Component Value Date   TSH 11.80 (H) 07/17/2018   TSH 0.17 (L) 03/12/2018   TSH 0.95 11/11/2017   FREET4 0.90 07/17/2018     FREET4 1.39 03/12/2018   FREET4 1.04 11/11/2017     Borderline hypertension She has had mildly increased blood pressure readings especially systolic She does not want to take any medications, this has been recommended by her PCP also  Has not had blood pressure checked recently even at home  BP Readings from Last 3 Encounters:  03/17/18 (!) 160/78  11/13/17 140/80  07/02/17 134/82      Hyperlipidemia:   On her previous visits she has agreed to restart taking Zocor but still not taking this She is usually refusing to consider statins for her hypercholesterolemia despite explaining the benefits of risk reduction regardless of cholesterol levels Last LDL  129  Lab Results  Component Value Date   CHOL 214 (H) 03/12/2018   CHOL 241 (H) 03/31/2017   CHOL 219 (H) 12/30/2016   Lab Results  Component Value Date   HDL 65.40 03/12/2018   HDL 69.40 03/31/2017   HDL 67.70 12/30/2016   Lab Results  Component Value Date   LDLCALC 129 (H) 03/12/2018   LDLCALC 159 (H) 03/31/2017   LDLCALC 136 (H) 12/30/2016   Lab Results  Component Value Date   TRIG 94.0 03/12/2018   TRIG 62.0 03/31/2017   TRIG 78.0 12/30/2016   Lab Results  Component Value Date   CHOLHDL 3 03/12/2018   CHOLHDL 3 03/31/2017   CHOLHDL 3 12/30/2016   Lab Results  Component Value Date   LDLDIRECT 114.0 06/26/2017   LDLDIRECT 137.3 02/24/2013   LDLDIRECT 152.6 10/20/2012       EXAM:  There were no vitals taken for this visit.   ASSESSMENT:  DIABETES type 1:  See history of present illness for detailed discussion of current management, blood sugar patterns and problems identified  A1c has not been checked since last June  As before her time within range is about 67% and average blood sugar recently about 160 similar to previous data  Blood sugars are rising progressively when she gets up until just after lunch This is mostly a dawn phenomenon which persist Also is having several issues with  her transmitter and sensors Some of the issues may be related to not calibrating regularly enough However may do better with the 770 pump and this was discussed   Get A1c checked with her PCP on Monday, appointment next month  Bolus 1-2 units in the morning when blood sugars are rising especially if she is drinking coffee  Additional 2 to 4 units for higher fat meals  Bolus before eating consistently, about 10 minutes before  We will try his lunch upon her next prescription instead of Humalog and discussed that this would be likely controlling postprandial readings better  Consider trying a carbohydrate ratio 1: 8 in the lunch meal  She needs to exercise in the mornings to help her morning rise in blood sugar  Consider using the arm for the sensor if she continues to have the  filament getting bent  Follow-up in 3 months  LIPIDS: She has not had these checked since last year  HYPOTHYROIDISM: Her TSH needs to be rechecked and she will do this with her PCP next month  Visit summary instructions were sent on my chart    Patient Instructions  Make sure and bolus in the morning with your coffee or if you see the blood sugar rising after you wake up to  Try to bolus 10 minutes before meals as much as possible.  Start regular exercise, this would be ideal in the mornings when your sugars are going up  Before you refill Humalog let us know and we will try at a faster acting Lyumjev insulin.  Active insulin time to be 4 hours  Okay to upgrade to the 770 pump with the option of getting the 780 pump when approved.  Please send us the lab results including A1c and thyroid medication when done with your PCP    Reather LittlerAjay Demorio Seeley 06/07/2019, 4:08 PM

## 2019-06-07 NOTE — Patient Instructions (Signed)
Make sure and bolus in the morning with your coffee or if you see the blood sugar rising after you wake up to  Try to bolus 10 minutes before meals as much as possible.  Start regular exercise, this would be ideal in the mornings when your sugars are going up  Before you refill Humalog let us know and we will try at a faster acting Lyumjev insulin.  Active insulin time to be 4 hours  Okay to upgrade to the 770 pump with the option of getting the 780 pump when approved.  Please send [place] the lab results including A1c and thyroid medication when done with your PCP

## 2019-07-21 ENCOUNTER — Telehealth: Payer: Self-pay | Admitting: Endocrinology

## 2019-07-21 ENCOUNTER — Other Ambulatory Visit: Payer: Self-pay

## 2019-07-21 MED ORDER — FIASP 100 UNIT/ML ~~LOC~~ SOLN: SUBCUTANEOUS | 0 refills | Status: DC

## 2019-07-21 NOTE — Telephone Encounter (Signed)
[employer] called to advise that Humalog is no longer covered and they are asking if Novolog or Candie Mile can be substituted  REF #  [id number]

## 2019-07-21 NOTE — Telephone Encounter (Signed)
Switch to Fiasp same doses.  She will need to let us know if blood sugars are not as well controlled especially after meals

## 2019-07-22 NOTE — Telephone Encounter (Signed)
Rx sent and patient notified.

## 2019-07-29 ENCOUNTER — Other Ambulatory Visit: Payer: Self-pay

## 2019-07-29 MED ORDER — INSULIN ASPART 100 UNIT/ML ~~LOC~~ SOLN: 60.0000 [IU] | SUBCUTANEOUS | 1 refills | Status: AC

## 2019-08-04 ENCOUNTER — Telehealth: Payer: Self-pay

## 2019-08-04 NOTE — Telephone Encounter (Signed)
PA initiated via [email or web address] for Contour test strips.   Stacy Cain Key: [id number] Status: Sent to [facility] Drug: Contour Next Test strips Form: Ambulance person PA Form [phone] [place])

## 2019-08-06 ENCOUNTER — Telehealth: Payer: Self-pay

## 2019-08-06 NOTE — Telephone Encounter (Signed)
FAXED DOCUMENTS  Company: [employer] (Faxed on 08/05/19) Document: chart notes Other records requested: none at this time.  All above requested information has been faxed successfully to [employer] listed above. Documents and fax confirmation have been placed in the faxed file for future reference.

## 2019-08-10 NOTE — Telephone Encounter (Signed)
Status: Sent to [facility] Drug: Contour Next Test strips Form: Ambulance person PA Form [phone] [facility])

## 2019-08-13 NOTE — Telephone Encounter (Signed)
Outcome: Approved on July 8,2021 Your PA request has been approved.  Approval is good from 08/12/2019 through 08/11/2020.

## 2019-08-20 ENCOUNTER — Other Ambulatory Visit: Payer: Self-pay | Admitting: Endocrinology

## 2019-10-01 ENCOUNTER — Telehealth: Payer: Self-pay | Admitting: Endocrinology

## 2019-10-01 NOTE — Telephone Encounter (Signed)
error 

## 2019-10-19 IMAGING — DX DG WRIST COMPLETE 3+V*R*
4 series · 4 of 4 positions shown · non-contrast
Comparison: None.

CLINICAL DATA: Status post fall 3 weeks ago. Wrist pain and
swelling.

EXAM:
RIGHT WRIST - COMPLETE 3+ VIEW

[wrist pa]
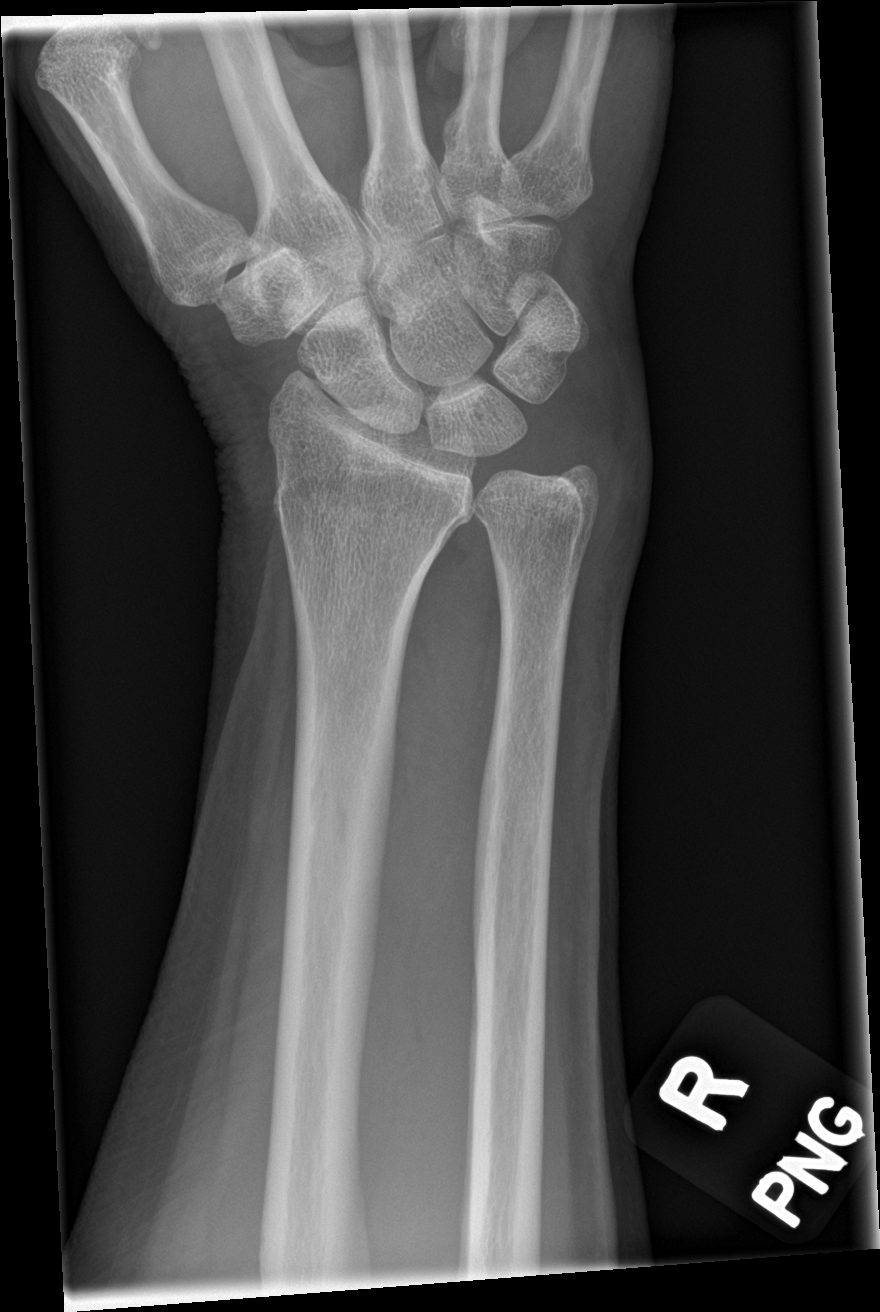

[wrist obl]
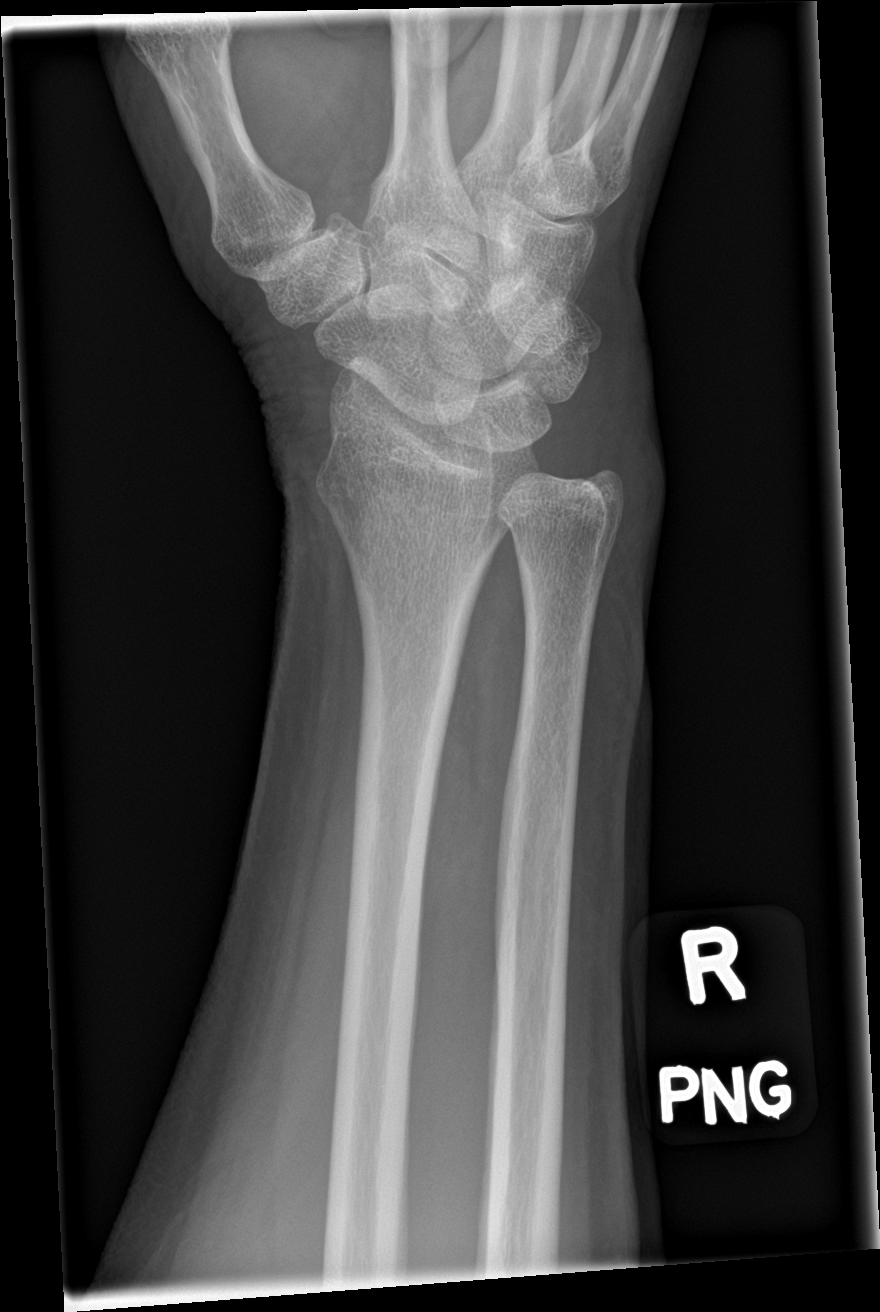

[wrist lat]
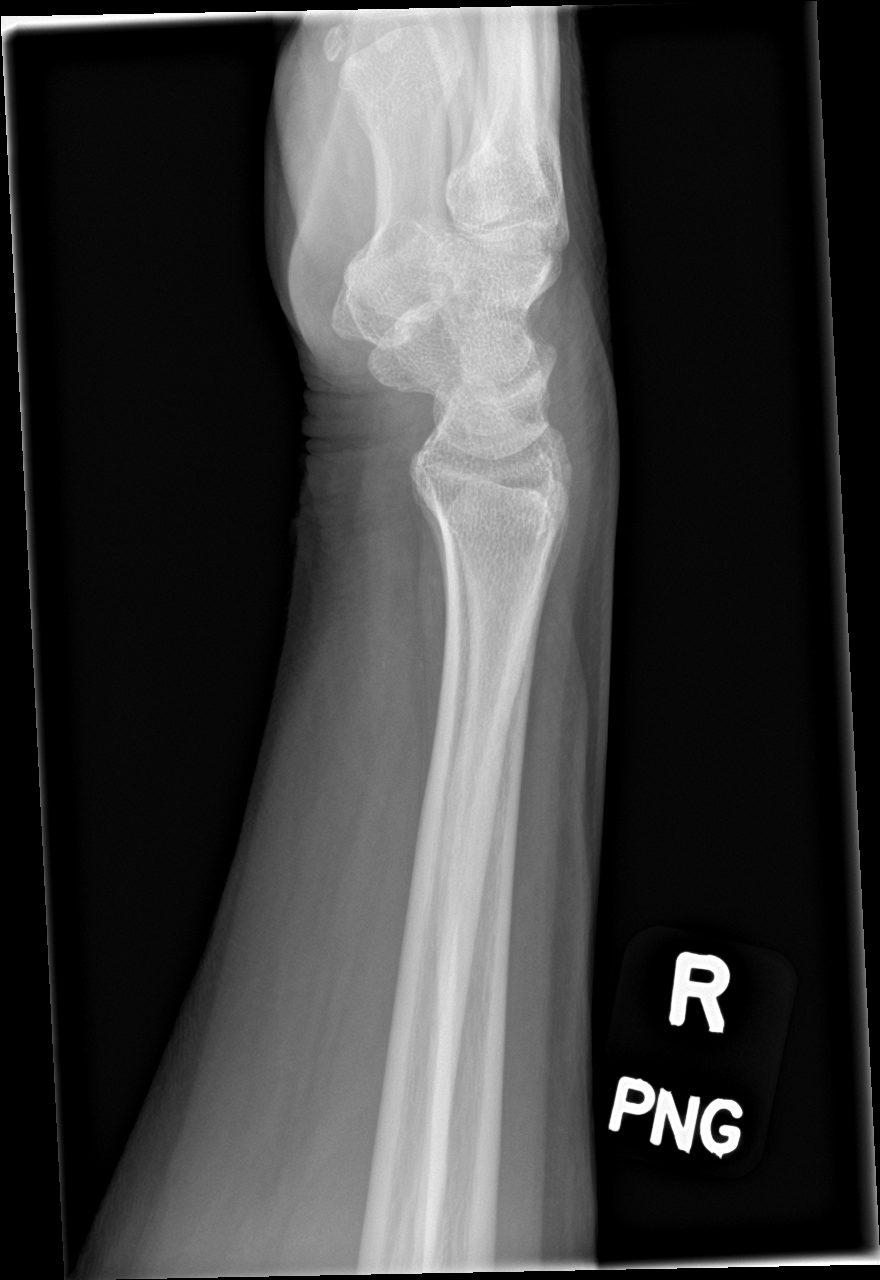

[navicular]
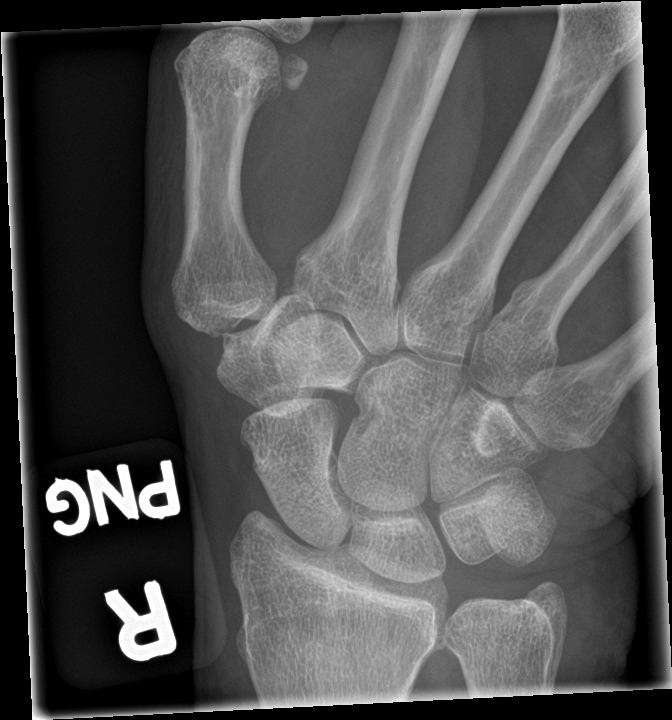

[4 of 4 positions shown; findings below may reference images not displayed]

FINDINGS: There is no evidence of fracture or dislocation. There is mild
osteoarthritis of the first CMC joint. The soft tissues are
unremarkable.
IMPRESSION: No acute osseous injury of the right wrist.

## 2020-04-04 ENCOUNTER — Telehealth: Payer: Self-pay | Admitting: Endocrinology

## 2020-04-04 NOTE — Telephone Encounter (Signed)
Pt was a previous pt of Dr. Lucianne Muss and has moved to [facility]. Pt needs her c-peptide labs that were done on September 23, 2016 to be sent to her new Endocrinologist at [facility] so she can receive her diabetic supplies.   [facility] Phone: [phone] Fax: [phone]

## 2020-04-05 NOTE — Telephone Encounter (Signed)
Faxed lab notes 2018 to [facility] Fax: [phone]

## 2023-03-08 DEATH — deceased
